# Patient Record
Sex: Male | Born: 1945
Health system: Southern US, Community
[De-identification: ages and names within clinical notes are randomized; demographics above are authoritative.]

## PROBLEM LIST (undated history)

## (undated) DIAGNOSIS — H5501 Congenital nystagmus: Secondary | ICD-10-CM

## (undated) DIAGNOSIS — I2510 Atherosclerotic heart disease of native coronary artery without angina pectoris: Secondary | ICD-10-CM

## (undated) DIAGNOSIS — E785 Hyperlipidemia, unspecified: Secondary | ICD-10-CM

## (undated) DIAGNOSIS — I10 Essential (primary) hypertension: Secondary | ICD-10-CM

## (undated) HISTORY — PX: CARDIAC CATHETERIZATION: SHX172

## (undated) HISTORY — DX: Atherosclerotic heart disease of native coronary artery without angina pectoris: I25.10

## (undated) HISTORY — DX: Hyperlipidemia, unspecified: E78.5

## (undated) HISTORY — DX: Essential (primary) hypertension: I10

## (undated) HISTORY — DX: Congenital nystagmus: H55.01

## (undated) HISTORY — PX: OTHER SURGICAL HISTORY: SHX169

---

## 2001-05-10 ENCOUNTER — Ambulatory Visit (HOSPITAL_COMMUNITY): Admission: RE | Admit: 2001-05-10 | Discharge: 2001-05-10 | Payer: Self-pay | Admitting: *Deleted

## 2001-05-10 ENCOUNTER — Encounter (INDEPENDENT_AMBULATORY_CARE_PROVIDER_SITE_OTHER): Payer: Self-pay | Admitting: Specialist

## 2007-01-29 ENCOUNTER — Ambulatory Visit: Payer: Self-pay | Admitting: Internal Medicine

## 2007-01-29 LAB — CONVERTED CEMR LAB
ALT: 27 units/L (ref 0–40)
CO2: 29 meq/L (ref 19–32)
Calcium: 9.8 mg/dL (ref 8.4–10.5)
Chloride: 108 meq/L (ref 96–112)
Cholesterol: 143 mg/dL (ref 0–200)
Creatinine, Ser: 1.1 mg/dL (ref 0.4–1.5)
GFR calc Af Amer: 88 mL/min
Glucose, Bld: 106 mg/dL — ABNORMAL HIGH (ref 70–99)
HDL: 41 mg/dL (ref >39.0)
LDL Cholesterol: 85 mg/dL (ref 0–99)
Phosphorus: 4 mg/dL (ref 2.3–4.6)

## 2007-03-12 ENCOUNTER — Ambulatory Visit: Payer: Self-pay | Admitting: Internal Medicine

## 2007-05-04 ENCOUNTER — Telehealth (INDEPENDENT_AMBULATORY_CARE_PROVIDER_SITE_OTHER): Payer: Self-pay | Admitting: *Deleted

## 2007-05-07 ENCOUNTER — Ambulatory Visit: Payer: Self-pay | Admitting: Internal Medicine

## 2007-05-07 DIAGNOSIS — R079 Chest pain, unspecified: Secondary | ICD-10-CM | POA: Insufficient documentation

## 2007-05-08 ENCOUNTER — Ambulatory Visit: Payer: Self-pay | Admitting: Cardiovascular Disease

## 2007-05-08 LAB — CONVERTED CEMR LAB
BUN: 22 mg/dL (ref 6–23)
Basophils Absolute: 0 10*3/uL (ref 0.0–0.1)
Basophils Relative: 0.1 % (ref 0.0–1.0)
CO2: 29 meq/L (ref 19–32)
Calcium: 9.8 mg/dL (ref 8.4–10.5)
Chloride: 107 meq/L (ref 96–112)
Creatinine, Ser: 1 mg/dL (ref 0.4–1.5)
HCT: 45.6 % (ref 39.0–52.0)
Hemoglobin: 15.4 g/dL (ref 13.0–17.0)
INR: 1 (ref 0.9–2.0)
MCHC: 33.8 g/dL (ref 30.0–36.0)
MCV: 93.1 fL (ref 78.0–100.0)
Monocytes Absolute: 1 10*3/uL — ABNORMAL HIGH (ref 0.2–0.7)
Neutrophils Relative %: 55.7 % (ref 43.0–77.0)
Platelets: 190 10*3/uL (ref 150–400)
Potassium: 4.1 meq/L (ref 3.5–5.1)
Prothrombin Time: 11.8 s (ref 10.0–14.0)
RBC: 4.9 M/uL (ref 4.22–5.81)
RDW: 12 % (ref 11.5–14.6)
Sodium: 141 meq/L (ref 135–145)

## 2007-05-09 ENCOUNTER — Ambulatory Visit: Payer: Self-pay | Admitting: Cardiovascular Disease

## 2007-05-09 ENCOUNTER — Inpatient Hospital Stay (HOSPITAL_COMMUNITY): Admission: RE | Admit: 2007-05-09 | Discharge: 2007-05-11 | Payer: Self-pay | Admitting: Cardiovascular Disease

## 2007-05-25 ENCOUNTER — Ambulatory Visit: Payer: Self-pay | Admitting: Cardiovascular Disease

## 2007-05-31 ENCOUNTER — Ambulatory Visit: Payer: Self-pay

## 2007-06-18 IMAGING — CR DG CHEST 2V
2 series · 2 of 2 positions shown · non-contrast
Comparison: none

CLINICAL DATA: Pre-cardiac catheterization.  Chest pain.
 CHEST ? 2 VIEW:

[view not recorded (1 of 2)]
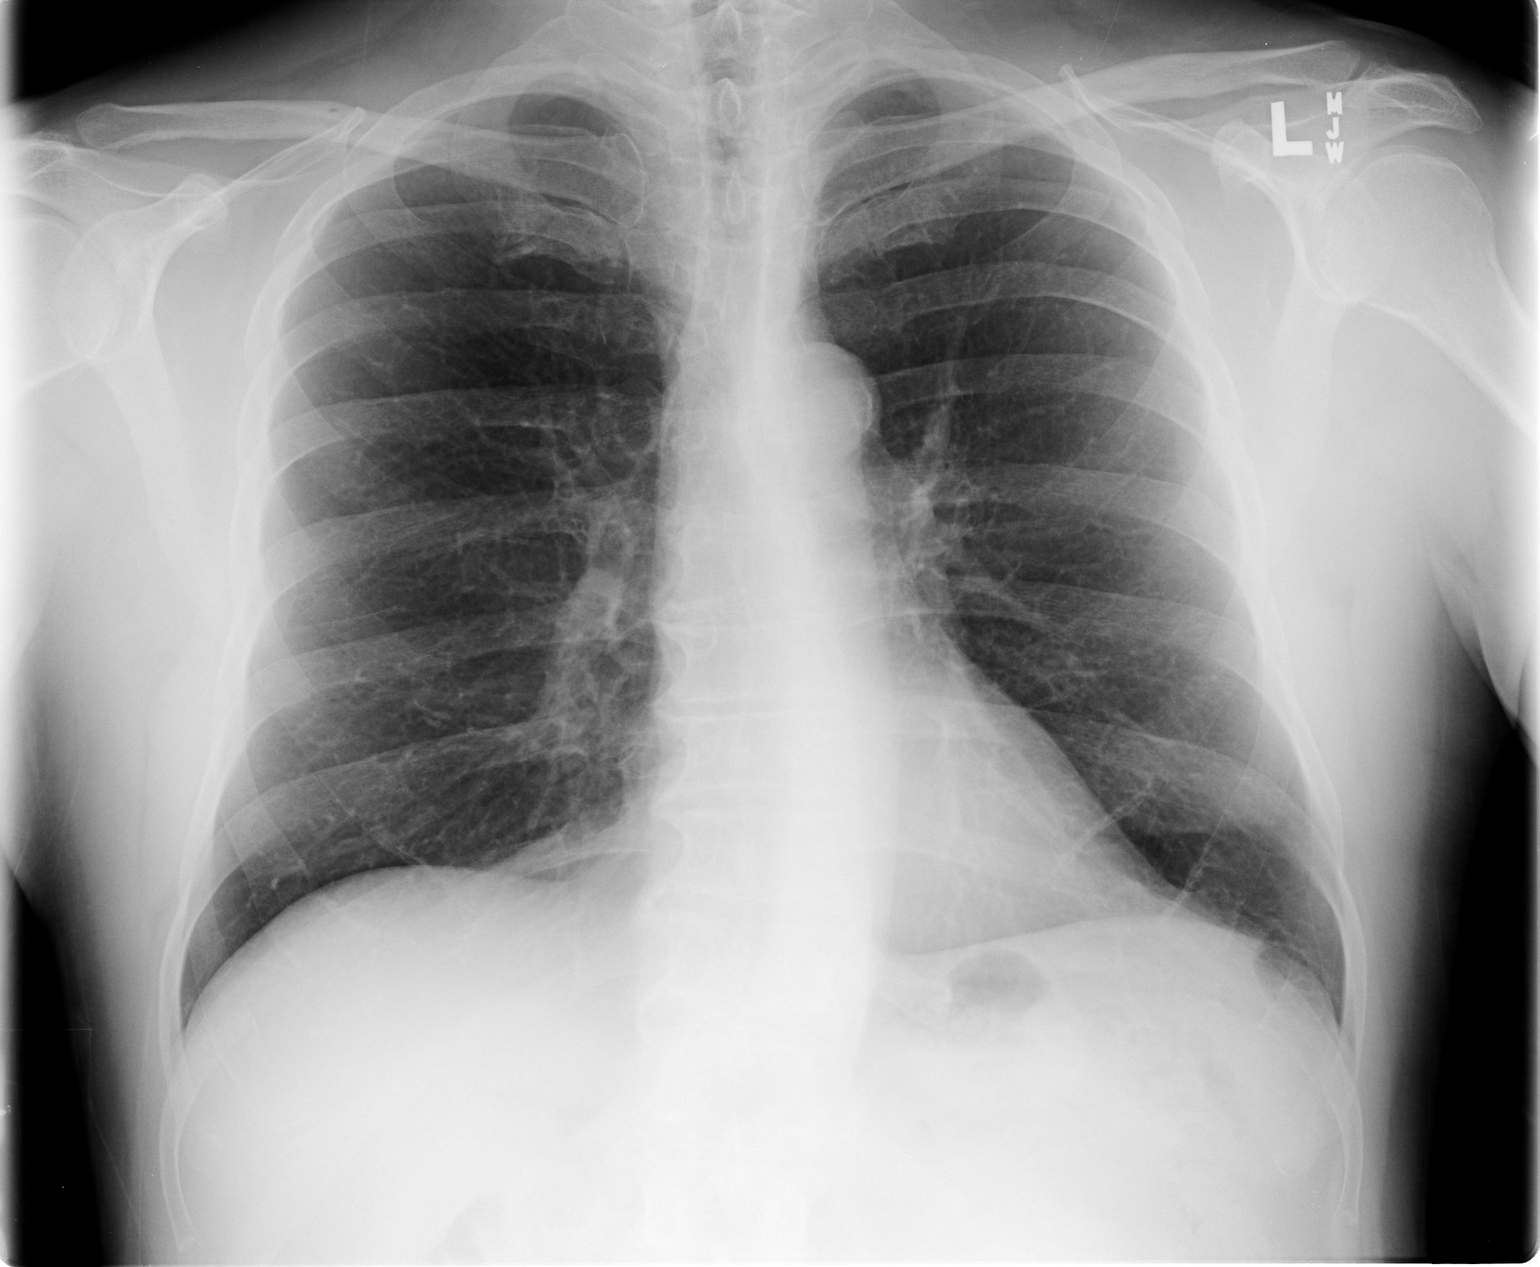

[view not recorded (2 of 2)]
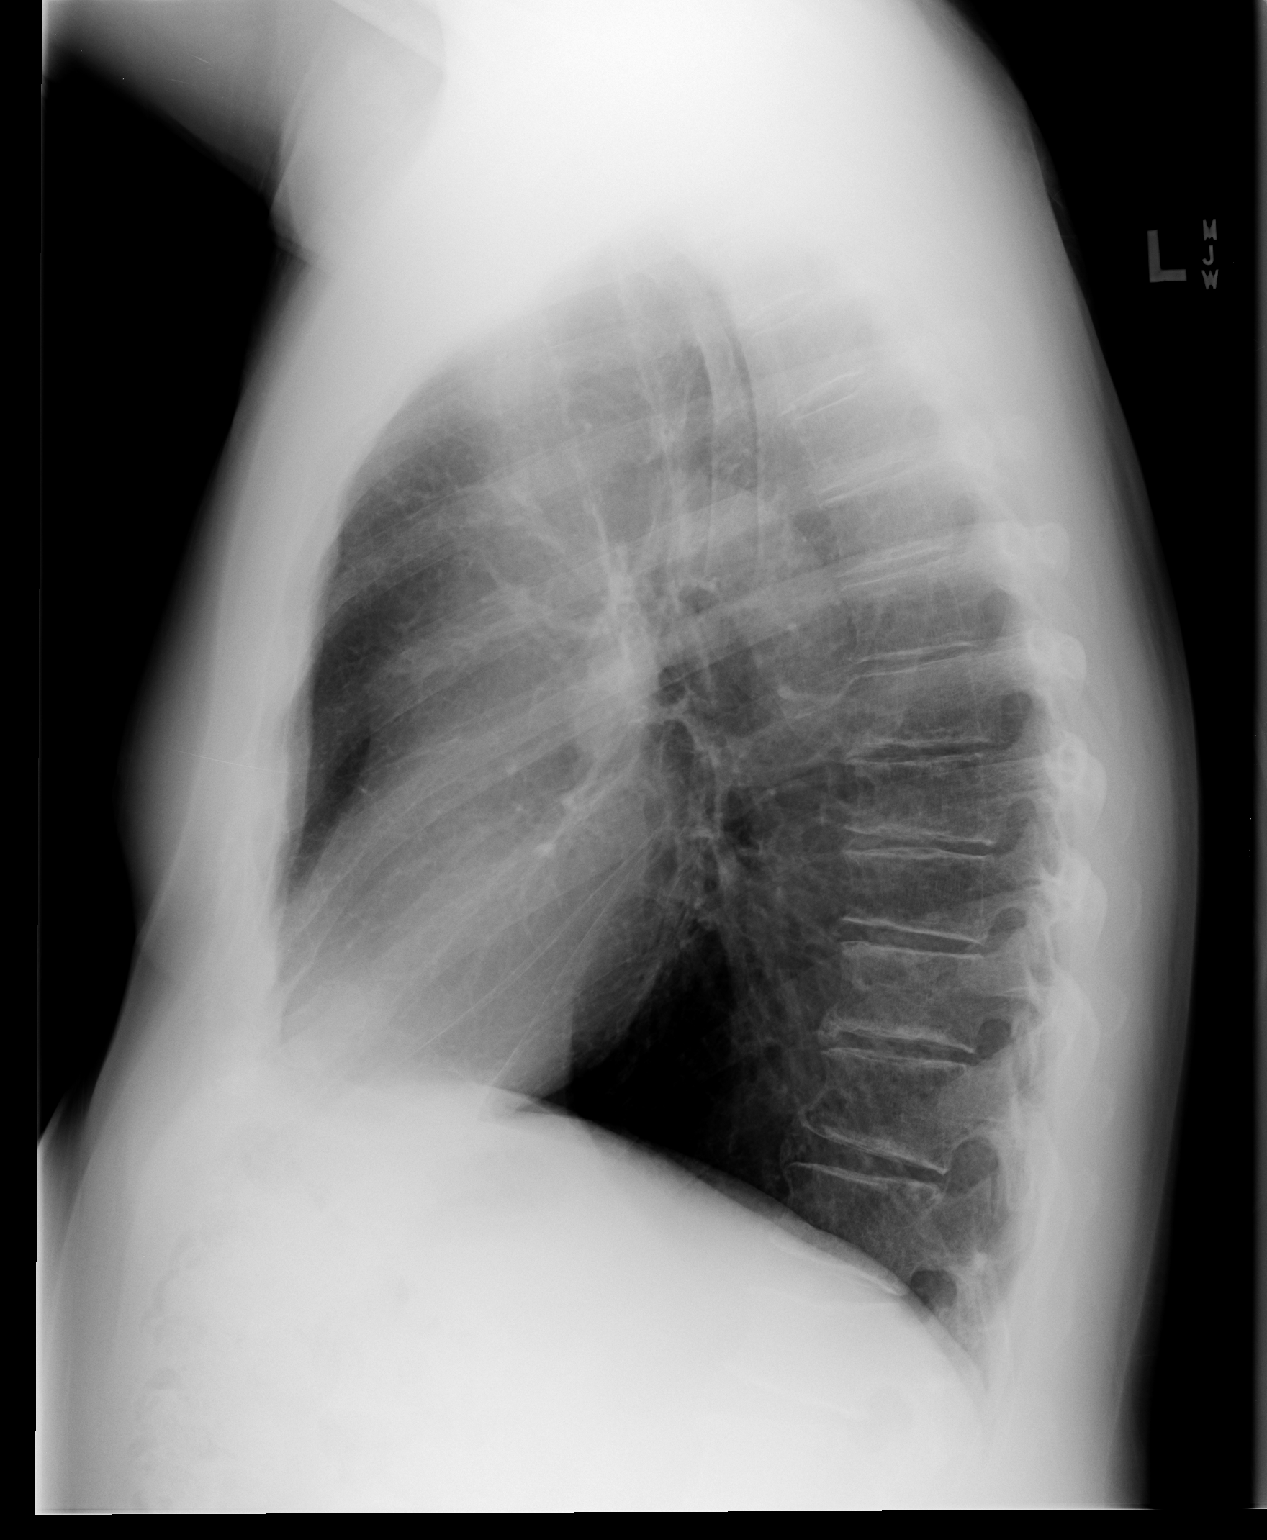

[2 of 2 positions shown; findings below may reference images not displayed]

FINDINGS: Two views of the chest show the lungs to be clear and hyperaerated.  The heart is within normal limits in size.  No bony abnormality is seen.
IMPRESSION: Hyperaeration.  No active lung disease.

## 2007-07-13 ENCOUNTER — Ambulatory Visit: Payer: Self-pay | Admitting: Cardiovascular Disease

## 2007-07-23 DIAGNOSIS — E785 Hyperlipidemia, unspecified: Secondary | ICD-10-CM | POA: Insufficient documentation

## 2007-07-23 DIAGNOSIS — I2510 Atherosclerotic heart disease of native coronary artery without angina pectoris: Secondary | ICD-10-CM | POA: Insufficient documentation

## 2007-07-23 DIAGNOSIS — I10 Essential (primary) hypertension: Secondary | ICD-10-CM | POA: Insufficient documentation

## 2007-07-23 DIAGNOSIS — H5501 Congenital nystagmus: Secondary | ICD-10-CM | POA: Insufficient documentation

## 2007-07-24 ENCOUNTER — Telehealth (INDEPENDENT_AMBULATORY_CARE_PROVIDER_SITE_OTHER): Payer: Self-pay | Admitting: *Deleted

## 2007-09-19 ENCOUNTER — Ambulatory Visit: Payer: Self-pay | Admitting: Cardiovascular Disease

## 2007-09-19 LAB — CONVERTED CEMR LAB
Alkaline Phosphatase: 65 units/L (ref 39–117)
Cholesterol: 145 mg/dL (ref 0–200)
LDL Cholesterol: 92 mg/dL (ref 0–99)
Total Bilirubin: 1.2 mg/dL (ref 0.3–1.2)
Total CHOL/HDL Ratio: 4.4
Total Protein: 6.8 g/dL (ref 6.0–8.3)
Triglycerides: 100 mg/dL (ref 0–149)

## 2007-10-19 ENCOUNTER — Ambulatory Visit: Payer: Self-pay | Admitting: Internal Medicine

## 2007-11-09 ENCOUNTER — Ambulatory Visit: Payer: Self-pay | Admitting: Cardiovascular Disease

## 2008-01-11 ENCOUNTER — Ambulatory Visit: Payer: Self-pay | Admitting: Cardiovascular Disease

## 2008-01-11 LAB — CONVERTED CEMR LAB
ALT: 21 units/L (ref 0–53)
AST: 23 units/L (ref 0–37)
Bilirubin, Direct: 0.2 mg/dL (ref 0.0–0.3)
Cholesterol: 134 mg/dL (ref 0–200)
HDL: 39.3 mg/dL (ref >39.0)
Total Bilirubin: 1.1 mg/dL (ref 0.3–1.2)
Total CHOL/HDL Ratio: 3.4
Total Protein: 6.7 g/dL (ref 6.0–8.3)
Triglycerides: 54 mg/dL (ref 0–149)
VLDL: 11 mg/dL (ref 0–40)

## 2008-05-12 ENCOUNTER — Ambulatory Visit: Payer: Self-pay | Admitting: Internal Medicine

## 2008-06-02 ENCOUNTER — Ambulatory Visit: Payer: Self-pay | Admitting: Cardiovascular Disease

## 2008-09-01 ENCOUNTER — Ambulatory Visit: Payer: Self-pay | Admitting: Internal Medicine

## 2008-11-30 DIAGNOSIS — I2510 Atherosclerotic heart disease of native coronary artery without angina pectoris: Secondary | ICD-10-CM | POA: Insufficient documentation

## 2009-01-27 ENCOUNTER — Ambulatory Visit: Payer: Self-pay | Admitting: Cardiovascular Disease

## 2009-01-27 LAB — CONVERTED CEMR LAB
ALT: 26 units/L (ref 0–53)
AST: 28 units/L (ref 0–37)
Alkaline Phosphatase: 59 units/L (ref 39–117)
Bilirubin, Direct: 0.1 mg/dL (ref 0.0–0.3)
Cholesterol: 155 mg/dL (ref 0–200)
Total Bilirubin: 0.9 mg/dL (ref 0.3–1.2)
Total Protein: 6.5 g/dL (ref 6.0–8.3)
Triglycerides: 122 mg/dL (ref 0–149)

## 2009-04-28 ENCOUNTER — Ambulatory Visit: Payer: Self-pay | Admitting: Cardiovascular Disease

## 2009-04-29 ENCOUNTER — Telehealth: Payer: Self-pay | Admitting: Cardiovascular Disease

## 2009-04-29 LAB — CONVERTED CEMR LAB
ALT: 16 units/L (ref 0–53)
Albumin: 3.7 g/dL (ref 3.5–5.2)
Bilirubin, Direct: 0 mg/dL (ref 0.0–0.3)
HDL: 33.7 mg/dL — ABNORMAL LOW (ref >39.00)
LDL Cholesterol: 52 mg/dL (ref 0–99)
Total Bilirubin: 0.8 mg/dL (ref 0.3–1.2)
Total CHOL/HDL Ratio: 3
Total Protein: 6.4 g/dL (ref 6.0–8.3)
Triglycerides: 91 mg/dL (ref 0.0–149.0)

## 2009-09-14 ENCOUNTER — Telehealth: Payer: Self-pay | Admitting: Cardiovascular Disease

## 2009-10-08 ENCOUNTER — Ambulatory Visit: Payer: Self-pay | Admitting: Cardiovascular Disease

## 2010-01-21 ENCOUNTER — Ambulatory Visit: Payer: Self-pay | Admitting: Cardiovascular Disease

## 2010-02-03 LAB — CONVERTED CEMR LAB
ALT: 17 units/L (ref 0–53)
Bilirubin, Direct: 0.1 mg/dL (ref 0.0–0.3)
HDL: 43.9 mg/dL (ref >39.00)
LDL Cholesterol: 85 mg/dL (ref 0–99)
Total Bilirubin: 0.9 mg/dL (ref 0.3–1.2)
Total Protein: 6.5 g/dL (ref 6.0–8.3)
VLDL: 18 mg/dL (ref 0.0–40.0)

## 2010-02-25 ENCOUNTER — Encounter: Payer: Self-pay | Admitting: Cardiovascular Disease

## 2010-07-12 ENCOUNTER — Telehealth (INDEPENDENT_AMBULATORY_CARE_PROVIDER_SITE_OTHER): Payer: Self-pay | Admitting: *Deleted

## 2010-08-12 ENCOUNTER — Telehealth: Payer: Self-pay | Admitting: Cardiovascular Disease

## 2010-08-30 ENCOUNTER — Encounter: Payer: Self-pay | Admitting: Cardiovascular Disease

## 2010-10-08 ENCOUNTER — Ambulatory Visit: Payer: Self-pay | Admitting: Cardiovascular Disease

## 2010-10-08 ENCOUNTER — Encounter: Payer: Self-pay | Admitting: Cardiovascular Disease

## 2010-10-14 ENCOUNTER — Telehealth: Payer: Self-pay | Admitting: Cardiovascular Disease

## 2011-01-04 NOTE — Progress Notes (Signed)
Summary: dicuss lab work   Phone Note Call from Patient Call back at [employer] [phone]   Caller: Patient Reason for Call: Talk to Nurse Summary of Call: pt having upcoming lab @ pcp,. wants to discuss lab work.  Initial call taken by: Lorne Skeens,  August 12, 2010 10:10 AM  Follow-up for Phone Call        I asked the pt to have his PCP fax his lab results to our office.  The pt is scheduled to see Dr Excell Seltzer in November. Follow-up by: Julieta Gutting, RN, BSN,  August 12, 2010 12:38 PM

## 2011-01-04 NOTE — Assessment & Plan Note (Signed)
Summary: f1y   Visit Type:  1 year follow up Primary Provider:  Cindee Salt MD  CC:  No cardiac complaints.  History of Present Illness: Jason Bell is a 65 year old gentleman with the coronary artery disease who initially presented with class III angina.  He underwent stenting of the proximal LAD and mid left circumflex in 2008 with drug-eluting stent platforms.  He has a chronically occluded right coronary artery that is collateralized from the left.  He underwent a Myoview perfusion scan in June 2008 following this intervention that demonstrated previous inferior and inferoseptal infarct with no ischemia with LV EF of 64% and the overall findings were considered low risk.   He continues to do well wihtout cardiovascular complaints. Denies chest pain, dyspnea, edema, palpitation. He does not exercise. He does physical work and has no exeritonal symptoms. Complains of ED and has better results with Cialis 20 mg - requests Rx for this.   Current Medications (verified): 1)  Metoprolol Succinate 50 Mg Xr24h-Tab (Metoprolol Succinate) .... Take One Tablet By Mouth Daily 2)  Multivitamins   Tabs (Multiple Vitamin) .... Take One By Mouth Once A Day 3)  Fish Oil 1000 Mg  Caps (Omega-3 Fatty Acids) .... Take One By Mouth Three Times A Day 4)  Cialis 10 Mg Tabs (Tadalafil) .... Take 1 Tablet By Mouth As Directed 5)  Simvastatin 40 Mg Tabs (Simvastatin) .... Take One Tablet By Mouth Daily At Bedtime 6)  Aspirin 81 Mg Tbec (Aspirin) .... Take 2 Tablets Am  Allergies: 1)  Lipitor (Atorvastatin Calcium)  Past History:  Past medical history reviewed for relevance to current acute and chronic problems.  Past Medical History: Reviewed history from 10/08/2009 and no changes required. Coronary artery disease s/p multivessel PCI 2008 Hyperlipidemia Hypertension Congenital nystagmus   Review of Systems       Negative except as per HPI   Vital Signs:  Patient profile:   65 year old  male Height:      65 inches Weight:      172 pounds BMI:     28.73 Pulse rate:   59 / minute Pulse rhythm:   regular Resp:     18 per minute BP sitting:   121 / 77  (left arm) Cuff size:   large  Vitals Entered By: Vikki Ports (October 08, 2010 8:44 AM)  Physical Exam  General:  Pt is alert and oriented, in no acute distress. HEENT: normal Neck: normal carotid upstrokes without bruits, JVP normal Lungs: CTA CV: RRR without murmur or gallop Abd: soft, NT, positive BS, no bruit, no organomegaly Ext: no clubbing, cyanosis, or edema. peripheral pulses 2+ and equal Skin: warm and dry without rash    EKG  Procedure date:  10/08/2010  Findings:      NSR 59 bpm, within normal limits.  Impression & Recommendations:  Problem # 1:  CAD, NATIVE VESSEL (ICD-414.01) Stable wihtout angina. Discussed need for weight loss and increased exercise. He has a treadmill he will begin to use. No med changes today except for Cialis 20 mg Rx - he understands contraindication with NTG.  His updated medication list for this problem includes:    Metoprolol Succinate 50 Mg Xr24h-tab (Metoprolol succinate) .Marland Kitchen... Take one tablet by mouth daily    Aspirin 81 Mg Tbec (Aspirin) .Marland Kitchen... Take 2 tablets am  Orders: EKG w/ Interpretation (93000)  Problem # 2:  HYPERTENSION (ICD-401.9) Well-controlled  His updated medication list for this problem includes:  Metoprolol Succinate 50 Mg Xr24h-tab (Metoprolol succinate) .Marland Kitchen... Take one tablet by mouth daily    Aspirin 81 Mg Tbec (Aspirin) .Marland Kitchen... Take 2 tablets am  Orders: EKG w/ Interpretation (93000)  BP today: 121/77 Prior BP: 120/78 (10/08/2009)  Labs Reviewed: K+: 4.1 (05/08/2007) Creat: : 1.0 (05/08/2007)   Chol: 147 (01/21/2010)   HDL: 43.90 (01/21/2010)   LDL: 85 (01/21/2010)   TG: 90.0 (01/21/2010)  Problem # 3:  HYPERLIPIDEMIA (ICD-272.4) Lipids at goal. Lifestyle modification reviewed with patient.  His updated medication list for this  problem includes:    Simvastatin 40 Mg Tabs (Simvastatin) .Marland Kitchen... Take one tablet by mouth daily at bedtime  Orders: EKG w/ Interpretation (93000)  CHOL: 147 (01/21/2010)   LDL: 85 (01/21/2010)   HDL: 43.90 (01/21/2010)   TG: 90.0 (01/21/2010)  Patient Instructions: 1)  Your physician has recommended you make the following change in your medication: INCREASE Cialis to 20mg  as needed  2)  Your physician wants you to follow-up in: 1 YEAR.   You will receive a reminder letter in the mail two months in advance. If you don't receive a letter, please call our office to schedule the follow-up appointment. Prescriptions: CIALIS 20 MG TABS (TADALAFIL) take one tablet by mouth as needed daily  #12 x 3   Entered by:   Julieta Gutting, RN, BSN   Authorized by:   Norva Karvonen, MD   Signed by:   Julieta Gutting, RN, BSN on 10/08/2010   Method used:   Electronically to        [facility] [phone]* (retail)       [address] [place], [place]  [address]       Ph: [phone] or [phone]       Fax: [phone]   RxID:   [id number]

## 2011-01-04 NOTE — Progress Notes (Signed)
Summary: REFILL  Phone Note Refill Request Message from:  Patient on October 14, 2010 4:03 PM  Refills Requested: Medication #1:  METOPROLOL SUCCINATE 50 MG XR24H-TAB Take one tablet by mouth daily [facility] [phone]  Initial call taken by: Judie Grieve,  October 14, 2010 4:03 PM  Follow-up for Phone Call        Rx faxed to pharmacy. Vikki Ports  October 14, 2010 4:44 PM      Prescriptions: METOPROLOL SUCCINATE 50 MG XR24H-TAB (METOPROLOL SUCCINATE) Take one tablet by mouth daily  #90 Tablet x 3   Entered by:   Vikki Ports   Authorized by:   Norva Karvonen, MD   Signed by:   Vikki Ports on 10/14/2010   Method used:   Faxed to ...       [facility] [phone]* (retail)       [address] [place], [place]  [address]       Ph: [phone] or [phone]       Fax: [phone]   RxID:   [id number]

## 2011-01-04 NOTE — Progress Notes (Signed)
Summary: chest tightness with exertion  Phone Note Call from Patient   Caller: Patient Call For: letvak Summary of Call: pt /co chest /shoulder tightness with  mild exertion and elevated bp for past few days. he is anxious about it and says his bp was 158/92 after waking up from a nap 30 min ago. he wants appt can I scuedule monday? Initial call taken by: Liane Comber,  May 04, 2007 2:59 PM  Follow-up for Phone Call        okay to schedule on Monday but make sure he knows to call 911 if he has significant pain that lasts for more than a very brief period Follow-up by: Cindee Salt MD,  May 04, 2007 3:31 PM  Additional Follow-up for Phone Call Additional follow up Details #1::        PATIENT ADVISED appt scheduled Additional Follow-up by: Liane Comber,  May 04, 2007 4:09 PM

## 2011-01-04 NOTE — Progress Notes (Signed)
Summary: refill  Phone Note Refill Request Call back at Home Phone [phone] Message from:  Patient on July 12, 2010 4:40 PM  Refills Requested: Medication #1:  CIALIS 10 MG TABS Take 1 tablet by mouth as directed [facility]  Initial call taken by: Judie Grieve,  July 12, 2010 4:41 PM Caller: Patient  Follow-up for Phone Call        Rx called to pharmacy. Vikki Ports  July 12, 2010 4:46 PM      Prescriptions: CIALIS 10 MG TABS (TADALAFIL) Take 1 tablet by mouth as directed  #12 x 2   Entered by:   Vikki Ports   Authorized by:   Norva Karvonen, MD   Signed by:   Vikki Ports on 07/12/2010   Method used:   Faxed to ...       [facility] [phone]* (retail)       [address] [place], [place]  [address]       Ph: [phone] or [phone]       Fax: [phone]   RxID:   [id number]

## 2011-04-19 NOTE — Assessment & Plan Note (Signed)
[facility]                            CARDIOLOGY OFFICE NOTE   QUANTAE, MARTEL                     MRN:          [id number]  DATE:05/25/2007                            DOB:          10-Feb-1946    Olaf Mesa returns for followup at the [facility] Office on  May 25, 2007.  He is a 65 year old man with coronary artery disease.  I  initially saw him on June 3 when he presented with crescendo angina.  He  had very typical symptoms and we elected to proceed directly with  cardiac catheterization.  His catheterization demonstrated 3-vessel  coronary artery disease.  He had focal disease in the LAD and left  circumflex, and an occluded right coronary artery that was  collateralized from the left.  He had inferior wall hypokinesis with an  LVEF estimated at 50%.  I elected to perform PCI of the LAD and left  circumflex with drug-eluting stents.  His angiographic result was  excellent.  Since his return home, he has symptomatically improved.  He  has continued to have some intermittent chest pains, many of which have  been fleeting, non-exertional pains.  He had 1 episode of exertional  chest discomfort that felt similar to the symptoms that he presented  with.  He has returned to light to moderate level activity and overall  is doing fairly well.   CURRENT MEDICATIONS:  1. Vytorin 10/20 mg daily.  2. Multivitamin daily.  3. Metoprolol ER 50 mg daily.  4. Aspirin 325 mg daily.  5. Plavix 75 mg daily.   EXAMINATION:  He is alert and oriented in no acute distress.  Weight 166 pounds, blood pressure 104/80, heart rate is 65, respiratory  rate 16.  HEENT:  Normal.  NECK:  Normal carotid upstrokes without bruits.  Jugular venous pressure  is normal.  LUNGS:  Clear to auscultation bilaterally.  HEART:  Regular rate and rhythm without murmurs or gallops.  ABDOMEN:  Soft and nontender.  No organomegaly.  No abdominal bruits.  EXTREMITIES:   No cyanosis, clubbing, or edema.  Peripheral pulses are 2+  and equal throughout.  Right femoral access site is well-healed.   EKG shows normal sinus rhythm and is within normal limits.   ASSESSMENT:  Mr. Sanzo is currently stable from a cardiovascular  standpoint.  His cardiac problems are as follows.  1. Coronary artery disease with [place] Cardiovascular Society class      II stable angina.  He has had improvement in his symptoms, although      I am concerned that he has continued to have occasional angina.  I      was hopeful that he would have a better symptomatic response to      percutaneous coronary intervention.  I would like to perform an      exercise Myoview stress study to evaluate for significant ischemia.      If he has significant ischemia in the inferior wall, may have to      consider revascularization of his right coronary artery.  However,  his right coronary artery is a fairly small vessel and I am not      sure that it would respond well to percutaneous coronary      intervention.  It is an occluded vessel that has already      collateralized, but if there is significant ischemia in that      territory, then an attempt at opening that up may be worth while.      In the meantime, he should continue on his current medical therapy      with beta blockade, aspirin, clopidogrel, and Vytorin.  2. Regarding his dyslipidemia, I elected to increase his Vytorin to      10/40 mg daily.  He should have repeat lipids and LFTs in 12 weeks.   FOLLOWUP:  I would like to see Mr. Defranco back in 8 weeks.  I will  follow up with him by telephone after the results of this stress study  are complete.     [name], MD  Electronically Signed    MDC/MedQ  DD: 05/28/2007  DT: 05/29/2007  Job #: [id number]   cc:   Karie Schwalbe, MD

## 2011-04-19 NOTE — Cardiovascular Report (Signed)
NAMEJURIS, Jason [name] NO.:  [id number]   MEDICAL RECORD NO.:  [id number]          PATIENT TYPE:  OIB   LOCATION:  6527                         FACILITY:  [facility]   PHYSICIAN:  [name], MD  DATE OF BIRTH:  November 21, 1946   DATE OF PROCEDURE:  05/09/2007  DATE OF DISCHARGE:                            CARDIAC CATHETERIZATION   PROCEDURE:  Left heart catheterization, selective coronary angiography,  left ventricular angiography, IVUS of the LAD, stenting of the LAD, PTCA  and stenting of the left circumflex, PTCA and stenting of the first  obtuse marginal, PTCA of the left circumflex, and StarClose of the right  femoral artery.   INDICATION:  Jason [name] is a 65 year old gentleman who I saw in clinic  on June 03 with new-onset angina.  His symptoms are classic for unstable  angina as he had sudden onset of exertional chest pain 6 days prior to  his office visit.  He has had exertional chest pressure with minimal  activity.  He did not have any resting symptoms.  I elected to bring him  in for a left heart catheterization in the setting of his classic  symptoms.   Risks and indications of the procedure were explained in detail to the  patient.  Informed consent was obtained.  The right groin was prepped,  draped, and anesthetized with 1% lidocaine.  Using modified Seldinger  technique, a 6-French sheath was placed in the right femoral artery.  Multiple views of the left coronary artery were taken.  A single view of  the right coronary artery was taken.  Standard Judkins catheters were  used.  Following selective coronary angiography, an angled pigtail  catheter was inserted into the left ventricle and pressures were  recorded.  Left ventriculogram was performed.  Pullback across the  aortic valve was done.   The diagnostic procedure demonstrated moderate to high-grade disease in  the proximal LAD and a very high-grade stenosis of the left circumflex  involving the first OM bifurcation as well as total occlusion of the  right coronary artery with left-to-right collaterals.  The right  coronary artery was a small vessel.  The disease was focal.  I elected  to perform intravascular ultrasound of the LAD to help in decision  making regarding revascularization.  Angiomax was used for  anticoagulation.  A 6-French XB 3.5 mm guide catheter was inserted.  Once therapeutic ACT was achieved, a Cougar guidewire was passed beyond  the area of stenosis in the proximal LAD and advanced down to the distal  LAD.  Intravascular ultrasound demonstrated high-grade stenosis  beginning at the first diagonal branch and covering approximately 10 mm  of the proximal LAD.  There was superficial calcification and a minimal  lumen area of 2.5 square millimeters.  I elected to primarily stent this  area with a 3.0 x 16 mm Endeavor stent.  The stent was deployed at 16  atmospheres.  I elected to postdilate the proximal portion of the stent  with a 3.5 x 8-mm Quantum Maverick which was inflated to 18 atmospheres.  Following postdilatation, a post-stent IVUS  was performed which  demonstrated good apposition throughout.  At that point, attention was  turned to the left circumflex.  The same Cougar guidewire was passed  down into the first OM branch.  The lesion was predilated with a 2.0 x  12 mm Sprinter balloon to 10 atmospheres.  Multiple views were taken and  this was a bifurcation lesion.  There appeared to be stenosis involving  the true circumflex as well.  I  elected to place a second Cougar  guidewire into the true circumflex branch.  The same 2.0  x 12 mm  balloon was advanced down and used to dilate the circumflex at 14  atmospheres.  Following this, the circumflex wire was pulled and a 2.5 x  12 mm Endeavor stent was placed from the mid circumflex across into the  OM-1 branch and deployed at 16 atmospheres.  The stent was then  postdilated with a 2.75 x 8  mm Quantum Maverick to 18 atmospheres on two  inflations.  Following postdilatation, there was excellent stent  expansion and TIMI-III flow in both branches.  There was very minor  compromise of the left circumflex but there did not appear to be any  significant stenosis and there was TIMI-III flow.  I elected not to  perform any further dilatations on that branch as I wanted to avoid  disrupting the stent in any way.  At the conclusion of the procedure,  right femoral arteriotomy was sealed with a StarClose device.  Mr.  [name] had a vagal reaction after the StarClose device was deployed  and he was given 1 mg of atropine.  He responded well and was in stable  condition at the conclusion of the procedure.   FINDINGS:  Aortic pressure 126/81 with a mean of 100.  Left ventricular  pressure is 129/6.   Left mainstem is angiographically normal.  It trifurcates into the LAD  intermediate branch and left circumflex.   The LAD is a large-caliber vessel that courses down and wraps around the  left ventricular apex.  There is a first diagonal branch that has a 50%  proximal stenosis.  The proximal portion of the LAD has an area of  moderate disease that is hypodense in appearance.  Angiographically, it  appears to be in the range of 70-75%.  The remaining portions of the mid  and distal LAD had no significant angiographic disease.   The ramus intermedius branch is a medium-sized vessel.  There is a 40%  stenosis in the proximal third of the vessel.   The left circumflex is a medium-sized vessel.  It courses down and gives  off a first OM branch.  Just at the first OM bifurcation, there is a 95%  stenosis in the mid circumflex.  The true circumflex and first OM branch  both appear to be 2.0-2.5 mm vessels and are both involved in the area  of high-grade stenosis.  The true AV groove circumflex courses down and  distally as a relatively small vessel.  The right coronary artery is a small  vessel that is dominant.  It is  diffusely diseased.  The mid portion of the vessel is occluded.  The  proximal portion of the vessel has moderate stenosis.  The distal right  coronary artery fills via left-to-right collateral flow.   Left ventriculography demonstrates mid inferior wall hypokinesis with an  LVEF of 50%.  There is no mitral regurgitation present.   ASSESSMENT:  1. Three-vessel coronary artery disease.  a.     Severe left circumflex disease involving the first obtuse       marginal bifurcation.      b.     There is moderate to severe left anterior descending       stenosis with a minimal lumen area of 2.5 millimeters by       intravascular ultrasound.      c.     Right coronary artery occlusion with left-to-right       collaterals.  2. Mild segmental left ventricular dysfunction with preserved overall      left ventricular ejection fraction.  3. Successful percutaneous coronary intervention of the left anterior      descending and left circumflex with single drug-eluting stents in      each vessel.   PLAN:  I recommend aspirin and clopidogrel for a minimum of 12 months  and if well tolerated then should continue with indefinite use.  We will  monitor overnight and continue with current medical therapy.      [name], MD  Electronically Signed     MDC/MEDQ  D:  05/10/2007  T:  05/10/2007  Job:  [id number]   cc:   Karie Schwalbe, MD

## 2011-04-19 NOTE — Letter (Signed)
May 08, 2007    Karie Schwalbe, MD  [address] [place], [place] [address]   RE:  Jason, [name]  MRN:  [id number]  /  DOB:  05-29-1946   Dear Luan Pulling:   It was my pleasure to see Jason [name] as an outpatient at the [facility]  Cardiology Office on May 08, 2007.  As you know, he is a 65 year old  gentleman who presented with a chief complaint of chest pain.   Jason [name] describes the onset of exertional chest pain approximately  6 days ago.  Prior to that, he had no similar symptoms.  He has had a  few twinges of chest pain in the past, but they have been fleeting.  Over the last 6 days, he describes substernal chest pressure radiating  to the shoulders, as well as up to the jaw with exertion.  He has had  symptoms at fairly low level activity, but has not had any resting  symptoms.  He denies any associated dyspnea, nausea, vomiting or  diaphoresis.  He went to the track 3 days ago and walked just over 1  mile to test his symptoms.  He had chest pain soon after starting his  walk, and it persisted until he stopped.  It lasted for approximately  another 15 minutes, and then resolved completely.  During that walk, he  described some lightheadedness as well, but has not had any other  associated symptoms.   PAST MEDICAL HISTORY:  Pertinent for the following:  1. Essential hypertension.  He has been treated with metoprolol for      the past 3 years.  He has no organ damage.  2. Dyslipidemia.  He is on Vytorin, and has also been taking this for      greater than 1 year.  3. No previous surgeries or hospitalizations.  4. No other medical problems to report.   SOCIAL HISTORY:  The patient is married.  He does not have children.  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  He has been  working there for many years.  He does not smoke cigarettes.  He drinks  alcohol on rare occasion.  He drinks a moderate level of caffeine.  He  does not participate  in any regular exercise.  He is originally from  [place], but has lived in [place] for over 20 years.   FAMILY HISTORY:  The patient's mother died at age 27 of multiple medical  problems.  His father died at age 52 of a cerebral hemorrhage.  He has a  sister who is 105, and has no medical problems.  There is no coronary  artery disease in the family.   CURRENT MEDICATIONS:  Include:  1. Metoprolol ER 25 mg daily.  2. Vytorin 10/20 mg daily.  3. Aspirin 81 mg daily.  4. Multivitamin daily.   ALLERGIES:  NO KNOWN DRUG ALLERGIES.   REVIEW OF SYSTEMS:  A complete 12-point review of systems was performed.  There were no pertinent positives to report.  He has no complaints other  than exertional chest pain, and the 1 episode of lightheadedness.   PHYSICAL EXAMINATION:  The patient is alert and oriented.  He is in no  acute distress.  He is a well-developed, well-nourished, white male.  His weight is 166 pounds.  Blood pressure in the right arm is 140/96,  left arm is 130/90.  Heart rate is 75.  Respiratory rate 16.  HEENT:  Normal.  NECK:  Normal carotid upstrokes without bruits.  Jugular venous pressure  is normal.  There is no thyromegaly or thyroid nodules.  LUNGS:  Clear to auscultation bilaterally.  CARDIOVASCULAR:  The apex is discrete and nondisplaced.  The heart is  regular rate and rhythm without murmurs or gallops.  There is no right  ventricular heave or lift.  ABDOMEN:  Soft and non-tender.  No organomegaly.  No abdominal bruits.  No masses.  BACK:  There is no CVA tenderness.  EXTREMITIES:  There is no clubbing, cyanosis, or edema.  There are no  femoral artery bruits.  The femoral pulses are 2+.  Posterior tibial and  dorsalis pedis pulses are 2+.  SKIN:  Warm and dry without rash.  NEUROLOGIC:  Cranial nerves 2 through 12 are intact.  Strength is 5/5  and equal in the arms and legs.  LYMPHATICS:  There is adenopathy.   EKG shows normal sinus rhythm, and  is within normal limits.  There are  no ST segment changes present.   Labs from February 2008 showed a creatinine of 1.1, potassium 4.5,  cholesterol 143, HDL 41, LDL 85, ALT 27.   ASSESSMENT:  Jason [name] is a 65 year old gentleman with new onset  exertional angina classic for unstable angina based on his recent  symptom onset.  This is in the setting of underlying cardiovascular risk  factors of hypertension and dyslipidemia.  As he does not have resting  symptoms, I did not feel that direct hospital admission is warranted.  However, with the recent onset of symptoms, and the occurrence with low  level activity, I think proceeding directly with cardiac catheterization  is warranted.  He has a high probability of significant underlying  coronary artery disease, and therefore, stress testing should be  skipped.  I reviewed this recommendation in detail with the patient and  his wife, and they are happy to proceed with cardiac catheterization, as  they are both very concerned about his new onset symptoms, and a  significant heart problem.  He is being scheduled for a left heart  catheterization tomorrow in the inpatient catheterization lab at [facility] in anticipation of requiring revascularization.  I reviewed the  risks and indications of the procedure in detail with the patient and  his wife, and they understand, and are agreeable to proceed.   I have asked Jason [name] to take 300 mg of Plavix today, and to start  75 mg of Plavix daily.  He was also asked to take 325 mg of aspirin  today, and start this dose as a daily dose as well.  His extended  release metoprolol dose was increased to 50 mg, and he was advised to  take an additional metoprolol pill this morning.  Finally, he was given  sublingual nitroglycerin, and instructed on its use.  I instructed them  in detail regarding the possibility of worsened chest pain or resting symptoms, and if this occurs, to activate EMS.    Rich, thanks again for the opportunity to see Jason [name].  I will be  in touch after his catheterization tomorrow.  Please feel free to call  at any time with questions regarding his care.    Sincerely,      [name], MD  Electronically Signed    MDC/MedQ  DD: 05/08/2007  DT: 05/08/2007  Job #: [id number]

## 2011-04-19 NOTE — Assessment & Plan Note (Signed)
[facility]                            CARDIOLOGY OFFICE NOTE   AADVIK, ROKER                     MRN:          [id number]  DATE:06/02/2008                            DOB:          1946-01-03    Jason [name] was seen in follow up at the [facility] Cardiology office on  June 02, 2008.  Jason [name] is a 65 year old gentleman with coronary  artery disease and stable angina.  He underwent stenting of the proximal  LAD and mid left circumflex back in June 2008.  He has an occluded right  coronary artery and history of silent inferior wall infarct.  Mr.  [name] is stable with no angina at present.  He has been able to  participate in regular exercise over the last several months with no  problems.  He has developed a left knee problem and his exercise has  been limited because of his knee pain.  He specifically denies chest  pain, dyspnea, orthopnea, PND, or edema.   Medications include aspirin 325 mg daily, Plavix 75 mg daily, metoprolol  ER 50 mg daily, multivitamin daily, Vytorin 10/40 mg daily, and fish oil  3 capsules daily.   ALLERGIES:  NKDA.   PHYSICAL EXAM:  GENERAL:  The patient is alert and oriented.  He is in  no acute distress.  VITAL SIGNS:  Weight is 178 pounds, blood pressure 122/83, heart rate  58, and respiratory rate 12.  HEENT:  Normal.  NECK:  Normal carotid upstrokes without bruits.  Jugular venous pressure  normal.  LUNGS:  Clear bilaterally.  HEART:  Regular rate and rhythm without murmurs or gallops.  ABDOMEN:  Soft, nontender, and no organomegaly.  EXTREMITIES:  No clubbing, cyanosis or edema.  Peripheral pulses are  intact and equal throughout.   EKG shows sinus bradycardia and is otherwise within normal limits.   ASSESSMENT:  1. Coronary artery disease.  No angina at present.  Continue medical      therapy as outlined above.  Reduce aspirin dose to 81 mg daily, but      continue dual antiplatelet therapy with  a combination of aspirin      and Plavix.  I am in favor of 2 years of aspirin and Plavix in and      his gentle gentleman with multivessel stenting.  He has had no      bleeding problems or other contraindications to ongoing      antiplatelet therapy.  2. Dyslipidemia.  Continue Vytorin.  Lipids from February showed a      cholesterol of 134, triglyceride is 54, HDL 39, and LDL 84.  Follow      up 1 year in February 2010.  3. Left knee pain.  He asked about the use of ibuprofen or other      nonsteroidals.  I think it is reasonable to use this      intermittently.  As noted above, his aspirin dose was reduced to 81      mg.  I asked him to ibuprofen sparingly if needed for pain.  4. Erectile dysfunction.  Jason [name] notes problems with erectile      dysfunction since starting on medical therapy for his CAD.  I      suspect this is related to the use of his beta-blocker.  He has      already reduced his metoprolol dose on his own to 25 mg.  He      continues to have problems.  I have written him a prescription for      Cialis 10 mg as needed.  He was warned of the contraindication with      concomitant use of nitrates.   For follow up, I would like to see Jason [name] back in 6 months.     [name], MD  Electronically Signed    MDC/MedQ  DD: 06/02/2008  DT: 06/03/2008  Job #: [id number]   cc:   Karie Schwalbe, MD

## 2011-04-19 NOTE — Assessment & Plan Note (Signed)
[facility]                            CARDIOLOGY OFFICE NOTE   GENTLE, HOGE                     MRN:          [id number]  DATE:07/13/2007                            DOB:          Apr 04, 1946    Taym Twist was seen in followup at the [facility] Cardiology Office on  July 13, 2007.  This dictation is delayed greater than 2 months as it  was not initially dictated and the chart just came back to me for  followup labs.  The dictation is done from my notes as I do not recall  much of the office visit at this point.   Mr. Jason [name] is a 65 year old gentleman who has coronary artery disease  and underwent multivessel stenting in the setting of normal left  ventricular function back in June of this year.  He had drug-eluting  stents placed in the LAD and left circumflex.  His right coronary artery  was occluded and received collaterals from the left.  At the time of his  visit, he complained of some chest pain with heavy exertion, but overall  was improved.  His chest pain was described as tightness.  He had no  other symptoms documented in the notes.   MEDICATIONS:  1. Multivitamin daily.  2. Metoprolol ER 50 mg daily.  3. Aspirin 325 mg daily.  4. Plavix 75 mg daily.  5. Vytorin 10/20 mg daily.   EXAM:  Weight 166, blood pressure 126/87, heart rate 74.  NECK:  Jugular venous pressure normal.  Carotid upstrokes 2+ without  bruits.  LUNGS:  Clear to auscultation bilaterally.  HEART:  Regular rate and rhythm without murmurs or gallops.  ABDOMEN:  Soft and nontender.  EXTREMITIES:  No cyanosis, clubbing, or edema.   ASSESSMENT AND PLAN:  Mr. Wyss is a 65 year old gentleman with  coronary artery disease and stable [place] [employer] class  II angina.  His medical therapy was continued.  He was encouraged  towards regular aerobic activity and exercise.   Regarding his dyslipidemia, I have now received lipids back in  followup  that were done on October 15.  His cholesterol was 145, triglycerides  100, HDL 33, and LDL 92.  I have elected to increase his Vytorin from  10/20 to 10/40.  We will follow up lipids and LFTs in 3 months.   Mr. Atkison will be seen back in followup next month.     [name], MD  Electronically Signed   MDC/MedQ  DD: 09/28/2007  DT: 09/28/2007  Job #: [id number]

## 2011-04-19 NOTE — Assessment & Plan Note (Signed)
[facility]                            CARDIOLOGY OFFICE NOTE   Jason [name], Jason [name]                     MRN:          [id number]  DATE:11/09/2007                            DOB:          Nov 04, 1946    PRIMARY CARE PHYSICIAN:  Karie Schwalbe, MD   REASON FOR PRESENTATION:  Evaluate patient with coronary disease, status  post PTCA.   HISTORY OF PRESENT ILLNESS:  The patient was due to see Dr. Excell Seltzer today  but he got called away urgently.  He has a history of coronary artery  disease as described below.  He has actually done quite well since the  last time he was seen.  He was describing apparently some chest  discomfort in August when he saw Dr. Excell Seltzer.  However, now he is able to  walk and even does his treadmill without any chest discomfort.  He  denies any neck or arm discomfort.  He denies any neck or arm  discomfort.  He has had no palpitations, no presyncope or syncope.  He  has had no shortness of breath, PND or orthopnea.   PAST MEDICAL HISTORY:  1. Coronary artery disease, occluded right coronary artery, 75%      proximal LAD stenosis, 95% mid circumflex stenosis.  The patient      had stenting with Endeavor stents to both the circumflex and the      LAD.  2. Well-preserved ejection fraction.  3. Dyslipidemia.  4. Hypertension.   ALLERGIES:  None.   MEDICATIONS:  1. Multivitamin.  2. Metoprolol 50 mg daily.  3. Aspirin 325 mg daily.  4. Plavix 75 mg daily.  5. Vytorin 10/40 mg.   REVIEW OF SYSTEMS:  As stated in the HPI and otherwise negative for  other systems.   PHYSICAL EXAMINATION:  The patient is in no distress.  Blood pressure 126/80, heart rate 72 and regular, weight 168 pounds.  HEENT:  Eyelids unremarkable.  Pupils equal, round, and reactive to  light.  Fundi not visualized.  Oral mucosa unremarkable.  NECK:  No jugular venous distention at 45 degrees, carotid upstroke  brisk and symmetric.  No bruits, no  thyromegaly.  LYMPHATIC:  No adenopathy.  LUNGS:  Clear to auscultation bilaterally.  BACK:  No costovertebral angle tenderness.  CHEST:  Unremarkable.  HEART:  PMI not displaced or sustained.  S1 and S2 within normal limits.  No S3, no S4.  No clicks, no rubs, no murmurs.  ABDOMEN:  Flat, positive bowel sounds, normal in frequency and pitch.  No bruits, no rebound, no guarding, no midline pulsatile mass.  No  hepatomegaly, no splenomegaly.  SKIN: No rash, no nodule.  EXTREMITIES:  2+ pulses, no edema.   ASSESSMENT AND PLAN:  1. Coronary disease.  The patient is now having no angina.  He is      participating in secondary risk reduction.  At this point no      further cardiovascular testing is suggested.  2. Dyslipidemia.  He did have his Vytorin increased recently by Dr.      Excell Seltzer.  The plan  is to repeat his lipids in January.  Further      adjustments will be based on these results.  We talked about fish      oil.  He eats a little fish occasionally but I thought it might be      reasonable to supplement with fish oil.  Also, increasing his      exercise will increase his low HDL.  3. Follow-up.  He will see Dr. Excell Seltzer in about 6 months or sooner if      needed.     Rollene Rotunda, MD, [facility]  Electronically Signed    [name]  DD: 11/09/2007  DT: 11/10/2007  Job #: [id number]   cc:   [name], MD

## 2011-04-19 NOTE — Discharge Summary (Signed)
NAMEJERMANY, RIMEL NO.:  [id number]   MEDICAL RECORD NO.:  [id number]          PATIENT TYPE:  OIB   LOCATION:  6527                         FACILITY:  [facility]   PHYSICIAN:  [name], MD  DATE OF BIRTH:  1946-01-06   DATE OF ADMISSION:  05/09/2007  DATE OF DISCHARGE:  05/11/2007                               DISCHARGE SUMMARY   PRIMARY CARDIOLOGIST:  [name], M.D.   PRIMARY CARE PHYSICIAN:  Karie Schwalbe, M.D.   DISCHARGE DIAGNOSES:  1. Coronary artery disease with periprocedural non-ST elevation      myocardial infarction.  2. Hypertension.  3. Hyperlipidemia.   ALLERGIES:  No known drug allergies.   PROCEDURE:  Left heart cardiac catheterization with successful PCI and  stenting of the left circumflex with a 2.5 x 12 mm Endeavor drug-eluting  stenting and PCI and stenting of the proximal LAD with 3.0 x 16 mm  Endeavor drug-eluting stenting.   HISTORY OF PRESENT ILLNESS:  A 65 year old male who recently saw Dr.  Excell Seltzer in the clinic on May 08, 2007, with a 6-day history of exertional  substernal chest discomfort with radiation to the jaw.  As a result of  these symptoms, arrangements were made for outpatient cardiac  catheterization.   HOSPITAL COURSE:  The patient presented to the catheterization lab on  June 4, where diagnostic catheterization revealed a 75% proximal  stenosis in the LAD with a 95% stenosis in the mid circumflex and an  occluded right coronary artery with left to right collaterals filling  the distal vessel.   Attention was then turned to the LAD and circumflex and both lesions  were stented with drug-eluting stents as outlined above.  His EF was 50%  with inferior wall hypokinesis.  Post procedure his cardiac markers  bumped to a peak CK of 137, MB of 16.9, and troponin 1.91.  He denied  any chest discomfort or shortness of breath, but did have an episode of  lightheadedness and weakness with near syncope  after ambulating for the  first time following his catheterization.  This was felt to be vasovagal  in nature.  Because of his elevated enzymes, we kept him an additional  24 hours.  He has had no recurrent chest discomfort or dyspnea and has  been ambulating without difficulty.  We will discharge him today in  satisfactory condition.   DISCHARGE LABORATORY DATA:  Hemoglobin 14.0, hematocrit 40.6, WBC 12.8,  platelets 177, MCV 91.8, neutrophils 68.  Sodium 139, potassium 3.9,  chloride 105, CO2 26, BUN 15, creatinine 0.99, glucose 193.  CK 76, MB  3.9, calcium 9.2.   DISPOSITION:  The patient is being discharged home today in good  condition.   FOLLOWUP:  He will see Dr. Excell Seltzer on June 20, at 9:45 a.m.  He will  follow up with Dr. Alphonsus Sias as previously scheduled.   DISCHARGE MEDICATIONS:  1. Aspirin 325 mg daily.  2. Plavix 75 mg daily.  3. Vytorin 10/20 mg q.h.s.  4. Toprol XL 50 mg daily.  5. Multivitamin one daily.  6. Nitroglycerin 0.4 mg  sublingual p.r.n. chest pain.   OUTSTANDING LAB STUDIES:  None.   DURATION DISCHARGE ENCOUNTER:  35 minutes including physician time.      Nicolasa Ducking, ANP      [name], MD  Electronically Signed    CB/MEDQ  D:  05/11/2007  T:  05/11/2007  Job:  [id number]   cc:   Karie Schwalbe, MD

## 2011-04-22 NOTE — Assessment & Plan Note (Signed)
[facility]                            CARDIOLOGY OFFICE NOTE   Jason Bell, Jason Bell                     MRN:          [id number]  DATE:01/27/2009                            DOB:          May 26, 1946    REASON FOR VISIT:  Coronary artery disease.   HISTORY OF PRESENT ILLNESS:  Jason Bell is a 65 year old gentleman  with the coronary artery disease who initially presented with class III  angina.  He underwent stenting of the proximal LAD and mid left  circumflex in 2008 with drug-eluting stent platforms.  He has a  chronically occluded right coronary artery that is collateralized from  the left.  He underwent a Myoview perfusion scan in June 2008 following  this intervention that demonstrated previous inferior and inferoseptal  infarct with no ischemia with LV EF of 64% and the overall findings were  considered low risk.   The patient has done well from a symptomatic standpoint.  He denies  exertional symptoms.  He specifically denies chest pain, dyspnea,  orthopnea, PND, edema, palpitations, lightheadedness, or syncope.  He  maintains a very moderately active lifestyle, but he has not been as  active with his exercise program over the winter months.   MEDICATIONS:  Multivitamin 1 daily, Plavix 75 mg daily, Vytorin  10/40  mg daily, fish oil 3 capsules daily, aspirin 81 mg daily, metoprolol ER  25 mg daily.   ALLERGIES:  No known drug allergies.   PHYSICAL EXAMINATION:  GENERAL:  The patient is alert and oriented.  In  no acute distress.  VITAL SIGNS:  Weight is 171 pounds, blood pressure 130/100, heart rate  66, respiratory rate 12.  HEENT:  Normal.  NECK:  Normal carotid upstrokes.  No bruits.  JVP normal.  LUNGS:  Clear bilaterally.  HEART:  Regular rate and rhythm.  No murmurs or gallops.  ABDOMEN:  Soft, nontender.  No organomegaly.  EXTREMITIES:  No clubbing, cyanosis, or edema.  Peripheral pulse is  intact and equal.  SKIN:  Warm,  dry without rash.   EKG shows normal sinus rhythm and it is within normal limits.  Heart  rate 66 beats per minute.   ASSESSMENT:  1. Coronary artery disease with previous percutaneous coronary      intervention, June 2008.  The patient is stable with no evidence of      angina.  He has had multivessel stenting.  He would like to      discontinue Plavix due to cost.  He is nearly 2 years out from his      intervention and I think it is certainly reasonable to discontinue      Plavix at this point.  I have asked him increase his aspirin to 162      mg daily.  Continue with low-dose beta blocker as well.  2. Dyslipidemia.  The patient has been on Vytorin.  He requests change      as this is not preferred on his insurance.  He was changed to      Crestor 20 mg daily.  Repeat lipids show a  cholesterol 155, LDL 89,      and HDL 41.  His liver function tests are normal.  We will repeat      lipids and LFTs in 12 weeks since his lipid-lowering therapy has      been changed.  3. For followup, I would like to see Jason Bell in 6 months.  Of      note, his blood pressure was elevated today, but it had been normal      on all of his past visits.  I have asked him to keep an eye on this      as an outpatient as he may require additional antihypertensive      therapy.     [name], MD     MDC/MedQ  DD: 02/04/2009  DT: 02/05/2009  Job #: [id number]

## 2011-04-22 NOTE — Procedures (Signed)
[facility]  Patient:    Jason Bell, Jason Bell                     MRN: [id number] Proc. Date: 05/10/01 Adm. Date:  [id number] Attending:  Sabino Gasser                           Procedure Report  PROCEDURE:  Colonoscopy with hot biopsy.  GASTROENTEROLOGIST:  Sabino Gasser, M.D.  INDICATIONS:  Colon polyp at 20 cm.  ANESTHESIA:  Demerol 75 mg, Versed 8 mg.  PROCEDURE IN DETAIL:  With the patient mildly sedated and in the left lateral decubitus position, rectal examination was performed which was unremarkable. Subsequently, the Olympus video colonoscope was inserted in the rectum and then passed under direct vision to the cecum, identified by ileocecal valve and appendiceal orifice, both of which were photographed.  From this point, the colonoscope was slowly withdrawn, taking circumferential views of the entire colonic mucosa, stopping only in the sigmoid colon where  diverticula were noted and moderately severe, and a polyp was seen at the 20 cm mark, small sessile, slightly erythematous.  It was photographed, and it was removed using hot biopsy forceps technique at setting of 20/20 blended current.  The endoscope was withdrawn to the rectum which appeared normal on direct view, and retroflexed view showed hemorrhoids.  The patients vital signs and pulse oximetry remained stable.  The patient tolerated the procedure well with no apparent complications.  FINDINGS: 1. Diverticulosis of sigmoid colon. 2. Polyp at 20 cm removed. 3. Internal hemorrhoids.  PLAN:  Await biopsy report.  The patient will call me for results and follow up with me as an outpatient. DD:  05/10/01 TD:  05/10/01 Job: [id number]

## 2011-06-20 ENCOUNTER — Other Ambulatory Visit: Payer: Self-pay | Admitting: Cardiovascular Disease

## 2011-06-28 ENCOUNTER — Encounter: Payer: Self-pay | Admitting: Cardiovascular Disease

## 2011-09-22 LAB — BASIC METABOLIC PANEL
BUN: 17
Calcium: 8.5
Calcium: 9.2
Creatinine, Ser: 1.05
GFR calc Af Amer: >60
GFR calc Af Amer: >60
GFR calc non Af Amer: >60
Glucose, Bld: 103 — ABNORMAL HIGH
Sodium: 139
Sodium: 139

## 2011-09-22 LAB — DIFFERENTIAL
Basophils Relative: 0
Lymphocytes Relative: 23
Lymphs Abs: 2.9
Monocytes Absolute: 1 — ABNORMAL HIGH
Monocytes Relative: 8
Neutro Abs: 8.7 — ABNORMAL HIGH
Neutrophils Relative %: 68

## 2011-09-22 LAB — CBC
Hemoglobin: 14
Hemoglobin: 14.2
MCHC: 33.7
MCHC: 34.4
MCV: 92.8
Platelets: 177
RBC: 4.43
RBC: 4.53
RDW: 13.1
WBC: 12.8 — ABNORMAL HIGH
WBC: 13.6 — ABNORMAL HIGH

## 2011-09-22 LAB — TROPONIN I: Troponin I: 0.43 — ABNORMAL HIGH

## 2011-09-22 LAB — CK TOTAL AND CKMB (NOT AT ARMC)
CK, MB: 3.9
Relative Index: INVALID
Relative Index: INVALID
Total CK: 76
Total CK: 79

## 2011-09-22 LAB — CARDIAC PANEL(CRET KIN+CKTOT+MB+TROPI)
CK, MB: 16.9 — ABNORMAL HIGH
Relative Index: 12.3 — ABNORMAL HIGH
Total CK: 137

## 2011-11-03 ENCOUNTER — Encounter: Payer: Self-pay | Admitting: Cardiovascular Disease

## 2011-11-04 ENCOUNTER — Encounter: Payer: Self-pay | Admitting: Cardiovascular Disease

## 2011-11-04 ENCOUNTER — Ambulatory Visit (INDEPENDENT_AMBULATORY_CARE_PROVIDER_SITE_OTHER): Payer: 59 | Admitting: Cardiovascular Disease

## 2011-11-04 VITALS — BP 142/78 | HR 89 | Ht 65.0 in | Wt 173.4 lb

## 2011-11-04 DIAGNOSIS — I2510 Atherosclerotic heart disease of native coronary artery without angina pectoris: Secondary | ICD-10-CM

## 2011-11-04 DIAGNOSIS — E785 Hyperlipidemia, unspecified: Secondary | ICD-10-CM

## 2011-11-04 DIAGNOSIS — I10 Essential (primary) hypertension: Secondary | ICD-10-CM

## 2011-11-04 NOTE — Patient Instructions (Signed)
Your physician wants you to follow-up in: one year You will receive a reminder letter in the mail two months in advance. If you don't receive a letter, please call our office to schedule the follow-up appointment.  

## 2011-11-04 NOTE — Assessment & Plan Note (Signed)
The patient is on simvastatin 40 mg at bedtime. His lipids are followed by Dr. Earl Gala.

## 2011-11-04 NOTE — Assessment & Plan Note (Signed)
The patient is stable without angina. His medications are unchanged over time and he is tolerating them well. He really needs to focus on diet and an exercise program. We discussed this and he plans on initiating an exercise program leading to his retirement from work.

## 2011-11-04 NOTE — Progress Notes (Signed)
HPI:  Jason Bell returns for followup of his coronary artery disease.  He has multivessel CAD and underwent stenting of the proximal LAD and mid left circumflex back in 2008 with drug-eluting stent platforms. The patient has a total occlusion of the right coronary artery that is collateralized from the left. He is stable without angina at this point. He specifically denies chest pain or pressure with activity. He has not engaged in regular physical exercise but he remains active with his work. He denies dyspnea, orthopnea, or PND. He has developed cataracts and will probably require surgery next year. He is planning on retiring next spring.  Outpatient Encounter Prescriptions as of 11/04/2011  Medication Sig Dispense Refill  . aspirin 81 MG tablet Take 81 mg by mouth 2 (two) times daily.       . metoprolol (TOPROL-XL) 50 MG 24 hr tablet Take 50 mg by mouth daily.        . Multiple Vitamin (MULTIVITAMIN PO) Take by mouth daily.        . Omega-3 Fatty Acids (FISH OIL PO) Take by mouth daily.        . simvastatin (ZOCOR) 80 MG tablet Take 40 mg by mouth at bedtime.        . tadalafil (CIALIS) 20 MG tablet Take 20 mg by mouth daily as needed.        Marland Kitchen DISCONTD: simvastatin (ZOCOR) 80 MG tablet TAKE 1 TABLET EVERY DAY  90 tablet  2    Allergies  Allergen Reactions  . Atorvastatin     Past Medical History  Diagnosis Date  . Coronary artery disease     s/p multivessel PCI 2008  . Hyperlipidemia   . Hypertension   . Congenital nystagmus     ROS: Negative except as per HPI  BP 142/78  Pulse 89  Ht 5\' 5"  (1.651 m)  Wt 78.654 kg (173 lb 6.4 oz)  BMI 28.86 kg/m2  PHYSICAL EXAM: Pt is alert and oriented, NAD HEENT: normal Neck: JVP - normal, carotids 2+= without bruits Lungs: CTA bilaterally CV: RRR without murmur or gallop Abd: soft, NT, Positive BS, no hepatomegaly Ext: no C/C/E, distal pulses intact and equal Skin: warm/dry no rash  EKG:  Normal sinus rhythm 89 beats per minute,  nonspecific ST abnormality.  ASSESSMENT AND PLAN:

## 2011-11-04 NOTE — Assessment & Plan Note (Signed)
Blood pressure is mildly elevated at 142/78. He understands this is probably related to his weight. He will focus on lifestyle modification.

## 2011-11-14 ENCOUNTER — Other Ambulatory Visit: Payer: Self-pay | Admitting: Cardiovascular Disease

## 2012-04-20 ENCOUNTER — Encounter: Payer: Self-pay | Admitting: Cardiovascular Disease

## 2012-04-25 ENCOUNTER — Other Ambulatory Visit: Payer: Self-pay | Admitting: Cardiovascular Disease

## 2012-09-28 ENCOUNTER — Encounter (INDEPENDENT_AMBULATORY_CARE_PROVIDER_SITE_OTHER): Payer: Medicare Other | Admitting: Ophthalmology

## 2012-09-28 DIAGNOSIS — H35039 Hypertensive retinopathy, unspecified eye: Secondary | ICD-10-CM

## 2012-09-28 DIAGNOSIS — H26499 Other secondary cataract, unspecified eye: Secondary | ICD-10-CM

## 2012-09-28 DIAGNOSIS — H3530 Unspecified macular degeneration: Secondary | ICD-10-CM

## 2012-09-28 DIAGNOSIS — I10 Essential (primary) hypertension: Secondary | ICD-10-CM

## 2012-09-28 DIAGNOSIS — H43819 Vitreous degeneration, unspecified eye: Secondary | ICD-10-CM

## 2012-10-15 ENCOUNTER — Encounter (INDEPENDENT_AMBULATORY_CARE_PROVIDER_SITE_OTHER): Payer: Medicare Other | Admitting: Ophthalmology

## 2012-10-15 DIAGNOSIS — H26499 Other secondary cataract, unspecified eye: Secondary | ICD-10-CM

## 2012-10-16 ENCOUNTER — Encounter: Payer: Self-pay | Admitting: Cardiovascular Disease

## 2012-10-16 ENCOUNTER — Ambulatory Visit (INDEPENDENT_AMBULATORY_CARE_PROVIDER_SITE_OTHER): Payer: Medicare Other | Admitting: Cardiovascular Disease

## 2012-10-16 VITALS — BP 126/78 | HR 73 | Ht 63.5 in | Wt 172.4 lb

## 2012-10-16 DIAGNOSIS — I2510 Atherosclerotic heart disease of native coronary artery without angina pectoris: Secondary | ICD-10-CM

## 2012-10-16 NOTE — Progress Notes (Signed)
   HPI:  3 her old gentleman presenting for followup evaluation. The patient has multivessel coronary artery disease and underwent stenting of the proximal LAD in mid left circumflex in 2008. He has chronic total occlusion of the right coronary artery collateralized from the left. He presents today for followup evaluation.  Amond retired about 6 months ago. He feels well. She's not been engaged in any regular exercise. He denies chest pain or pressure, dyspnea, edema, palpitations, lightheadedness, or syncope.  He's had some problems with his eyes and has undergone 4 different surgeries, now with good improvement in his vision. He otherwise has had an uneventful last 12 months.  Outpatient Encounter Prescriptions as of 10/16/2012  Medication Sig Dispense Refill  . aspirin 81 MG tablet Take 81 mg by mouth 2 (two) times daily.       Marland Kitchen CIALIS 20 MG tablet TAKE ONE TABLET BY MOUTH AS NEEDED DAILY  12 tablet  2  . metoprolol (TOPROL-XL) 50 MG 24 hr tablet TAKE 1 TABLET EVERY DAY  90 tablet  3  . Multiple Vitamin (MULTIVITAMIN PO) Take by mouth daily.        . Omega-3 Fatty Acids (FISH OIL PO) Take by mouth daily.        . simvastatin (ZOCOR) 80 MG tablet TAKE 1 TABLET EVERY DAY  90 tablet  2  . [DISCONTINUED] simvastatin (ZOCOR) 80 MG tablet Take 40 mg by mouth at bedtime.          Allergies  Allergen Reactions  . Atorvastatin     Past Medical History  Diagnosis Date  . Coronary artery disease     s/p multivessel PCI 2008  . Hyperlipidemia   . Hypertension   . Congenital nystagmus     ROS: Negative except as per HPI  BP 126/78  Pulse 73  Ht 5' 3.5" (1.613 m)  Wt 78.2 kg (172 lb 6.4 oz)  BMI 30.06 kg/m2  SpO2 95%  PHYSICAL EXAM: Pt is alert and oriented, NAD HEENT: normal Neck: JVP - normal, carotids 2+= without bruits Lungs: CTA bilaterally CV: RRR without murmur or gallop Abd: soft, NT, Positive BS, no hepatomegaly Ext: no C/C/E, distal pulses intact and equal Skin:  warm/dry no rash  EKG:  Normal sinus rhythm 74 beats per minute, within normal limits.  ASSESSMENT AND PLAN: 1. Coronary artery disease, native vessel. The patient is stable without anginal symptoms. He appears to have good control of his cardiovascular risk factors. He remains on aspirin 81 mg daily, a statin drug, and the beta blocker. I will see him back in 12 months.  2. Hypercholesterolemia. The patient is on high dose simvastatin as tolerated this for some time. Dr. Earl Gala follows his labs on a yearly basis. We will request a copy of most recent labs (last copy on file from 06/28/11).  For follow-up will see him back in 12 months.  Tonny Bollman 10/16/2012 12:11 PM

## 2012-10-16 NOTE — Patient Instructions (Addendum)
Your physician wants you to follow-up in: 1 YEAR with Dr Cooper.  You will receive a reminder letter in the mail two months in advance. If you don't receive a letter, please call our office to schedule the follow-up appointment.  Your physician recommends that you continue on your current medications as directed. Please refer to the Current Medication list given to you today.  

## 2012-10-25 ENCOUNTER — Ambulatory Visit (INDEPENDENT_AMBULATORY_CARE_PROVIDER_SITE_OTHER): Payer: Medicare Other | Admitting: Ophthalmology

## 2012-10-25 DIAGNOSIS — H2700 Aphakia, unspecified eye: Secondary | ICD-10-CM

## 2012-12-04 ENCOUNTER — Other Ambulatory Visit: Payer: Self-pay | Admitting: Cardiovascular Disease

## 2013-04-25 ENCOUNTER — Encounter: Payer: Self-pay | Admitting: Cardiovascular Disease

## 2013-07-29 ENCOUNTER — Other Ambulatory Visit: Payer: Self-pay | Admitting: Cardiovascular Disease

## 2013-10-16 ENCOUNTER — Encounter: Payer: Self-pay | Admitting: Cardiovascular Disease

## 2013-10-16 ENCOUNTER — Ambulatory Visit (INDEPENDENT_AMBULATORY_CARE_PROVIDER_SITE_OTHER): Payer: Medicare Other | Admitting: Cardiovascular Disease

## 2013-10-16 VITALS — BP 122/88 | HR 76 | Ht 63.5 in | Wt 159.0 lb

## 2013-10-16 DIAGNOSIS — I10 Essential (primary) hypertension: Secondary | ICD-10-CM

## 2013-10-16 DIAGNOSIS — E785 Hyperlipidemia, unspecified: Secondary | ICD-10-CM

## 2013-10-16 DIAGNOSIS — I2510 Atherosclerotic heart disease of native coronary artery without angina pectoris: Secondary | ICD-10-CM

## 2013-10-16 NOTE — Patient Instructions (Signed)
Your physician wants you to follow-up in: 1 YEAR with Dr Cooper.  You will receive a reminder letter in the mail two months in advance. If you don't receive a letter, please call our office to schedule the follow-up appointment.  Your physician recommends that you continue on your current medications as directed. Please refer to the Current Medication list given to you today.  

## 2013-10-16 NOTE — Progress Notes (Signed)
    HPI:  67 year old gentleman presenting for followup evaluation. The patient has been followed for coronary artery disease. He has multivessel disease and was treated with stenting of the LAD and left circumflex in 2008. He has a chronic total occlusion of the right coronary artery collateralized from the left. He's done well with no angina in recent years. He retired last year and is more physically active than he has been in the past. He is currently working on a suspension ceiling at his house. He's lost over 10 pounds since his last visit one year ago. He denies chest pain, chest pressure, shortness of breath, leg swelling, lightheadedness, or palpitations.  Outpatient Encounter Prescriptions as of 10/16/2013  Medication Sig  . aspirin 81 MG tablet Take 81 mg by mouth 2 (two) times daily.   Marland Kitchen CIALIS 20 MG tablet TAKE ONE TABLET BY MOUTH AS NEEDED DAILY  . metoprolol succinate (TOPROL-XL) 50 MG 24 hr tablet TAKE 1 TABLET EVERY DAY  . Multiple Vitamin (MULTIVITAMIN PO) Take by mouth daily.    . Omega-3 Fatty Acids (FISH OIL PO) Take by mouth daily.    . simvastatin (ZOCOR) 80 MG tablet TAKE 1 TABLET EVERY DAY    Allergies  Allergen Reactions  . Atorvastatin     Past Medical History  Diagnosis Date  . Coronary artery disease     s/p multivessel PCI 2008  . Hyperlipidemia   . Hypertension   . Congenital nystagmus     ROS: Negative except as per HPI  BP 122/88  Pulse 76  Ht 5' 3.5" (1.613 m)  Wt 159 lb (72.122 kg)  BMI 27.72 kg/m2  PHYSICAL EXAM: Pt is alert and oriented, NAD HEENT: normal Neck: JVP - normal, carotids 2+= without bruits Lungs: CTA bilaterally CV: RRR without murmur or gallop Abd: soft, NT, Positive BS, no hepatomegaly Ext: no C/C/E, distal pulses intact and equal Skin: warm/dry no rash  EKG:  Normal sinus rhythm, within normal limits.  ASSESSMENT AND PLAN: 1. Coronary atherosclerosis, native vessel. The patient is stable without anginal symptoms.  His medical program includes aspirin, a beta blocker, and a statin drug.  2. Hypertension. Blood pressure is controlled on metoprolol succinate.  3. Hyperlipidemia. Lipids are followed by Dr. Earl Gala. His cholesterol was 160, triglycerides 110, HDL 40, and LDL 99. He is maintained on high dose simvastatin. We reviewed the importance of establishing a regular exercise program and continue to focus on diet and weight loss

## 2014-04-23 ENCOUNTER — Other Ambulatory Visit: Payer: Self-pay | Admitting: Cardiovascular Disease

## 2014-05-01 ENCOUNTER — Encounter: Payer: Self-pay | Admitting: Cardiovascular Disease

## 2014-06-07 ENCOUNTER — Other Ambulatory Visit: Payer: Self-pay | Admitting: Cardiovascular Disease

## 2014-07-24 ENCOUNTER — Other Ambulatory Visit: Payer: Self-pay | Admitting: Cardiovascular Disease

## 2014-10-17 ENCOUNTER — Ambulatory Visit (INDEPENDENT_AMBULATORY_CARE_PROVIDER_SITE_OTHER): Payer: Medicare Other | Admitting: Nurse Practitioner

## 2014-10-17 ENCOUNTER — Encounter: Payer: Self-pay | Admitting: Nurse Practitioner

## 2014-10-17 VITALS — BP 130/84 | HR 56 | Ht 63.0 in | Wt 183.8 lb

## 2014-10-17 DIAGNOSIS — E785 Hyperlipidemia, unspecified: Secondary | ICD-10-CM

## 2014-10-17 DIAGNOSIS — I10 Essential (primary) hypertension: Secondary | ICD-10-CM

## 2014-10-17 DIAGNOSIS — I2510 Atherosclerotic heart disease of native coronary artery without angina pectoris: Secondary | ICD-10-CM

## 2014-10-17 MED ORDER — METOPROLOL SUCCINATE ER 50 MG PO TB24: ORAL_TABLET | ORAL | Status: DC

## 2014-10-17 MED ORDER — TADALAFIL 20 MG PO TABS: ORAL_TABLET | ORAL | Status: DC

## 2014-10-17 MED ORDER — SIMVASTATIN 80 MG PO TABS: ORAL_TABLET | ORAL | Status: DC
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
## 2014-10-17 NOTE — Patient Instructions (Addendum)
Stay on your current medicines -  I have refilled them today  See Dr. Excell Seltzerooper in one year  Call the [facility] office at [phone] if you have any questions, problems or concerns.

## 2014-10-22 ENCOUNTER — Telehealth: Payer: Self-pay | Admitting: *Deleted

## 2014-10-22 NOTE — Telephone Encounter (Signed)
[facility] called and stated that the cialis is going to cost the patient $519.00 for 12 pills. She was wanting to see if Dr Excell Seltzerooper would be willing to switch the patient to viagra 20mg , which the patient can get 90 pills for only $66.00. Please advise. Thanks, MI

## 2014-10-22 NOTE — Telephone Encounter (Signed)
That's fine thx

## 2014-10-23 ENCOUNTER — Other Ambulatory Visit: Payer: Self-pay | Admitting: *Deleted

## 2014-10-23 MED ORDER — SILDENAFIL CITRATE 20 MG PO TABS: 100.0000 mg | ORAL_TABLET | Freq: Every day | ORAL | Status: DC | PRN

## 2014-10-23 NOTE — Telephone Encounter (Signed)
Viagra does not come in a 20mg , she was actually referring to sildenafil (revatio) but she stated that is was like a generic form of viagra. Is this ok? If so please advise on sig. Thanks, MI

## 2014-10-23 NOTE — Telephone Encounter (Signed)
Pt was given a Rx for Cialis 20mg  prn. I spoke with Baxter HireKristen Pharm-D in our Anticoagulation clinic and she said the comparison dosage in Viagra is  100mg . The pt can be given a Rx for Sildenafil 20mg  take 5 tablets prior to sexual activity prn.

## 2015-04-27 ENCOUNTER — Telehealth: Payer: Self-pay | Admitting: Cardiovascular Disease

## 2015-04-27 NOTE — Telephone Encounter (Signed)
New Message   Pt c/o BP issue: STAT if pt c/o blurred vision, one-sided weakness or slurred speech  1. What are your last 5 BP readings? 128/86, 75:22. 147/87, 76P: 134/82, 78: 141/92, 77P; 145/93, 88P (177/103, 119) 1224Pm on 04/25/15  2. Are you having any other symptoms (ex. Dizziness, headache, blurred vision, passed out)? Had early Saturday am an anxiety attack;  And has a headache rt now   3. What is your BP issue? Rapid heart beat and spiking of BP

## 2015-04-27 NOTE — Telephone Encounter (Signed)
Agreed thx 

## 2015-04-27 NOTE — Telephone Encounter (Signed)
I spoke with the Jason Bell and he is concerned about his BP and pulse fluctuating. He has been monitoring his readings for the past week and a half and has noticed that his BP increases to the 150/90 range with exertion. On Saturday morning the Jason Bell woke up early and started worrying about his BP and had a "panic attack". He developed tightness in his chest and heart rate increased, this lasted about a minute and he developed a dull headache. Saturday afternoon he was watching a movie when he got real warm and his BP when up to 177/103 and pulse 119.  He applied a cold cloth to his head and things settled down. Yesterday and today the Jason Bell has not had any problems.  The Jason Bell's BP this morning was 128/83.  The Jason Bell said he got up and fed his cat and his BP went to 144/91. I made the Jason Bell aware that he needs to limit checking his BP.  The Jason Bell also has not been active during the winter months and he plans to begin exercising again.  I advised him to start with walking and increase length of exercise over time. We also discussed limiting sodium in his diet (Saturday he ate a can of [employer] soup for lunch). The Jason Bell will contact the office with any other questions or concerns.

## 2015-06-11 ENCOUNTER — Other Ambulatory Visit: Payer: Self-pay | Admitting: Gastroenterology

## 2015-07-10 ENCOUNTER — Other Ambulatory Visit: Payer: Self-pay | Admitting: *Deleted

## 2015-07-10 MED ORDER — METOPROLOL SUCCINATE ER 50 MG PO TB24
ORAL_TABLET | ORAL | Status: DC
Start: 2015-07-10 — End: 2015-12-03

## 2015-09-02 ENCOUNTER — Encounter (HOSPITAL_COMMUNITY): Payer: Self-pay | Admitting: *Deleted

## 2015-09-08 ENCOUNTER — Other Ambulatory Visit: Payer: Self-pay | Admitting: Gastroenterology

## 2015-09-13 NOTE — Anesthesia Preprocedure Evaluation (Addendum)
Anesthesia Evaluation  Patient identified by MRN, date of birth, ID band Patient awake    Reviewed: Allergy & Precautions, H&P , NPO status , Patient's Chart, lab work & pertinent test results, reviewed documented beta blocker date and time   Airway Mallampati: I  TM Distance: >3 FB Neck ROM: Full    Dental no notable dental hx. (+) Teeth Intact, Dental Advisory Given   Pulmonary neg pulmonary ROS,    Pulmonary exam normal breath sounds clear to auscultation       Cardiovascular hypertension, Pt. on medications and Pt. on home beta blockers + CAD and + Cardiac Stents   Rhythm:Regular Rate:Normal     Neuro/Psych negative neurological ROS  negative psych ROS   GI/Hepatic negative GI ROS, Neg liver ROS,   Endo/Other  negative endocrine ROS  Renal/GU negative Renal ROS  negative genitourinary   Musculoskeletal   Abdominal   Peds  Hematology negative hematology ROS (+)   Anesthesia Other Findings   Reproductive/Obstetrics negative OB ROS                           Anesthesia Physical Anesthesia Plan  ASA: III  Anesthesia Plan: MAC   Post-op Pain Management:    Induction: Intravenous  Airway Management Planned: Simple Face Mask  Additional Equipment:   Intra-op Plan:   Post-operative Plan:   Informed Consent: I have reviewed the patients History and Physical, chart, labs and discussed the procedure including the risks, benefits and alternatives for the proposed anesthesia with the patient or authorized representative who has indicated his/her understanding and acceptance.   Dental advisory given  Plan Discussed with: CRNA  Anesthesia Plan Comments:        Anesthesia Quick Evaluation

## 2015-09-14 ENCOUNTER — Ambulatory Visit (HOSPITAL_COMMUNITY)
Admission: RE | Admit: 2015-09-14 | Discharge: 2015-09-14 | Disposition: A | Discharge status: Home / Self Care | Payer: Medicare Other | Source: Ambulatory Visit | Attending: Gastroenterology | Admitting: Gastroenterology

## 2015-09-14 ENCOUNTER — Encounter (HOSPITAL_COMMUNITY): Payer: Self-pay

## 2015-09-14 ENCOUNTER — Ambulatory Visit (HOSPITAL_COMMUNITY): Payer: Medicare Other | Admitting: Anesthesiology

## 2015-09-14 ENCOUNTER — Encounter (HOSPITAL_COMMUNITY)
Admission: RE | Disposition: A | Discharge status: Home / Self Care | Payer: Self-pay | Source: Ambulatory Visit | Attending: Gastroenterology

## 2015-09-14 DIAGNOSIS — K5730 Diverticulosis of large intestine without perforation or abscess without bleeding: Secondary | ICD-10-CM | POA: Diagnosis not present

## 2015-09-14 DIAGNOSIS — E7800 Pure hypercholesterolemia, unspecified: Secondary | ICD-10-CM | POA: Diagnosis not present

## 2015-09-14 DIAGNOSIS — M1990 Unspecified osteoarthritis, unspecified site: Secondary | ICD-10-CM | POA: Insufficient documentation

## 2015-09-14 DIAGNOSIS — I2510 Atherosclerotic heart disease of native coronary artery without angina pectoris: Secondary | ICD-10-CM | POA: Insufficient documentation

## 2015-09-14 DIAGNOSIS — I10 Essential (primary) hypertension: Secondary | ICD-10-CM | POA: Insufficient documentation

## 2015-09-14 DIAGNOSIS — Z86010 Personal history of colonic polyps: Secondary | ICD-10-CM | POA: Insufficient documentation

## 2015-09-14 DIAGNOSIS — Z955 Presence of coronary angioplasty implant and graft: Secondary | ICD-10-CM | POA: Diagnosis not present

## 2015-09-14 DIAGNOSIS — Z9849 Cataract extraction status, unspecified eye: Secondary | ICD-10-CM | POA: Diagnosis not present

## 2015-09-14 DIAGNOSIS — Z1211 Encounter for screening for malignant neoplasm of colon: Secondary | ICD-10-CM | POA: Diagnosis not present

## 2015-09-14 HISTORY — PX: COLONOSCOPY WITH PROPOFOL: SHX5780

## 2015-09-14 SURGERY — COLONOSCOPY WITH PROPOFOL
Anesthesia: Monitor Anesthesia Care

## 2015-09-14 MED ORDER — LIDOCAINE HCL (PF) 2 % IJ SOLN
INTRAMUSCULAR | Status: DC | PRN
  Administered 2015-09-14: 20 mg via INTRADERMAL

## 2015-09-14 MED ORDER — PROPOFOL 10 MG/ML IV BOLUS
INTRAVENOUS | Status: AC
  Filled 2015-09-14: qty 20

## 2015-09-14 MED ORDER — LACTATED RINGERS IV SOLN
INTRAVENOUS | Status: DC
Start: 2015-09-14 — End: 2015-09-16
  Administered 2015-09-14: 11:00:00 via INTRAVENOUS

## 2015-09-14 MED ORDER — SODIUM CHLORIDE 0.9 % IV SOLN: INTRAVENOUS | Status: DC

## 2015-09-14 MED ORDER — FENTANYL CITRATE (PF) 100 MCG/2ML IJ SOLN: 25.0000 ug | INTRAMUSCULAR | Status: DC | PRN

## 2015-09-14 MED ORDER — PROPOFOL 10 MG/ML IV BOLUS
INTRAVENOUS | Status: DC | PRN
  Administered 2015-09-14: 100 mg via INTRAVENOUS
  Administered 2015-09-14 (×2): 50 mg via INTRAVENOUS

## 2015-09-14 SURGICAL SUPPLY — 22 items

## 2015-09-14 NOTE — Transfer of Care (Signed)
Immediate Anesthesia Transfer of Care Note  Patient: Jason Bell  Procedure(s) Performed: Procedure(s): COLONOSCOPY WITH PROPOFOL (N/A)  Patient Location: PACU  Anesthesia Type:MAC  Level of Consciousness:  sedated, patient cooperative and responds to stimulation  Airway & Oxygen Therapy:Patient Spontanous Breathing and Patient connected to face mask oxgen  Post-op Assessment:  Report given to PACU RN and Post -op Vital signs reviewed and stable  Post vital signs:  Reviewed and stable  Last Vitals:  Filed Vitals:   09/14/15 1030  BP: 130/88  Pulse: 67  Temp: 36.7 C  Resp: 10    Complications: No apparent anesthesia complications
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
## 2015-09-14 NOTE — Discharge Instructions (Signed)

## 2015-09-14 NOTE — Op Note (Signed)
Procedure: Surveillance colonoscopy. Normal surveillance colonoscopy performed on 05/31/2010. Adenomatous colon polyps removed colonoscopically in the past.  Endoscopist: Danise Edge  Premedication: Propofol administered by anesthesia  Procedure: The patient was placed in the left lateral decubitus position. Anal inspection and digital rectal exam were normal. The Pentax pediatric colonoscope was introduced into the rectum and advanced to the cecum. A normal-appearing ileocecal valve and appendiceal orifice were identified. Colonic preparation for the exam today was satisfactory. Withdrawal time was 12 minutes  Rectum. Normal. Retroflexed view of the distal rectum was normal.  Sigmoid colon and descending colon. Left colonic diverticulosis  Splenic flexure. Normal  Transverse colon. Normal  Hepatic flexure. Normal  Ascending colon. Normal  Cecum and ileocecal valve. Normal  Assessment: Normal surveillance colonoscopy.

## 2015-09-14 NOTE — Anesthesia Postprocedure Evaluation (Signed)
  Anesthesia Post-op Note  Patient: Jason Bell  Procedure(s) Performed: Procedure(s): COLONOSCOPY WITH PROPOFOL (N/A)  Patient Location: PACU  Anesthesia Type: MAC  Level of Consciousness: awake and alert   Airway and Oxygen Therapy: Patient Spontanous Breathing  Post-op Pain: Controlled  Post-op Assessment: Post-op Vital signs reviewed, Patient's Cardiovascular Status Stable and Respiratory Function Stable  Post-op Vital Signs: Reviewed  Filed Vitals:   09/14/15 1215  BP:   Pulse: 68  Temp:   Resp: 19    Complications: No apparent anesthesia complications

## 2015-09-15 ENCOUNTER — Encounter (HOSPITAL_COMMUNITY): Payer: Self-pay | Admitting: Gastroenterology

## 2015-12-03 ENCOUNTER — Ambulatory Visit (INDEPENDENT_AMBULATORY_CARE_PROVIDER_SITE_OTHER): Payer: Medicare Other | Admitting: Cardiovascular Disease

## 2015-12-03 ENCOUNTER — Encounter: Payer: Self-pay | Admitting: Cardiovascular Disease

## 2015-12-03 VITALS — BP 120/90 | HR 70 | Ht 63.5 in | Wt 176.8 lb

## 2015-12-03 DIAGNOSIS — E785 Hyperlipidemia, unspecified: Secondary | ICD-10-CM

## 2015-12-03 DIAGNOSIS — I2510 Atherosclerotic heart disease of native coronary artery without angina pectoris: Secondary | ICD-10-CM

## 2015-12-03 DIAGNOSIS — I10 Essential (primary) hypertension: Secondary | ICD-10-CM | POA: Diagnosis not present
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
## 2015-12-03 NOTE — Patient Instructions (Signed)

## 2016-03-07 ENCOUNTER — Other Ambulatory Visit: Payer: Self-pay | Admitting: *Deleted

## 2016-03-07 MED ORDER — SIMVASTATIN 80 MG PO TABS: 40.0000 mg | ORAL_TABLET | Freq: Every day | ORAL | Status: DC

## 2016-05-13 DIAGNOSIS — I25119 Atherosclerotic heart disease of native coronary artery with unspecified angina pectoris: Secondary | ICD-10-CM | POA: Diagnosis not present

## 2016-05-13 DIAGNOSIS — Z1389 Encounter for screening for other disorder: Secondary | ICD-10-CM | POA: Diagnosis not present

## 2016-05-13 DIAGNOSIS — I10 Essential (primary) hypertension: Secondary | ICD-10-CM | POA: Diagnosis not present

## 2016-05-13 DIAGNOSIS — Z0000 Encounter for general adult medical examination without abnormal findings: Secondary | ICD-10-CM | POA: Diagnosis not present

## 2016-05-13 DIAGNOSIS — Z86010 Personal history of colonic polyps: Secondary | ICD-10-CM | POA: Diagnosis not present

## 2016-05-13 DIAGNOSIS — E7800 Pure hypercholesterolemia, unspecified: Secondary | ICD-10-CM | POA: Diagnosis not present

## 2016-05-13 DIAGNOSIS — Z125 Encounter for screening for malignant neoplasm of prostate: Secondary | ICD-10-CM | POA: Diagnosis not present

## 2016-08-29 ENCOUNTER — Other Ambulatory Visit: Payer: Self-pay | Admitting: Cardiovascular Disease

## 2016-08-29 MED ORDER — METOPROLOL SUCCINATE ER 50 MG PO TB24: 50.0000 mg | ORAL_TABLET | Freq: Every evening | ORAL | 2 refills | Status: DC

## 2016-09-01 DIAGNOSIS — Z23 Encounter for immunization: Secondary | ICD-10-CM | POA: Diagnosis not present

## 2016-10-29 ENCOUNTER — Other Ambulatory Visit: Payer: Self-pay | Admitting: Cardiovascular Disease

## 2016-12-24 ENCOUNTER — Other Ambulatory Visit: Payer: Self-pay | Admitting: Cardiovascular Disease

## 2016-12-27 NOTE — Telephone Encounter (Signed)
Please give 2 refills since the pt has an upcoming appointment with Dr Excell Seltzerooper. Thank you

## 2016-12-27 NOTE — Telephone Encounter (Signed)
Okay to refill? Last lipid panel in epic is from 2015 and per last office visit hyperlipidemia is followed by pcp. Please advise. Thanks, MI

## 2017-01-11 ENCOUNTER — Encounter: Payer: Self-pay | Admitting: Cardiovascular Disease

## 2017-01-11 ENCOUNTER — Encounter (INDEPENDENT_AMBULATORY_CARE_PROVIDER_SITE_OTHER): Payer: Self-pay

## 2017-01-11 ENCOUNTER — Ambulatory Visit (INDEPENDENT_AMBULATORY_CARE_PROVIDER_SITE_OTHER): Payer: Medicare Other | Admitting: Cardiovascular Disease

## 2017-01-11 VITALS — BP 132/70 | HR 68 | Ht 63.5 in | Wt 180.8 lb

## 2017-01-11 DIAGNOSIS — E785 Hyperlipidemia, unspecified: Secondary | ICD-10-CM | POA: Diagnosis not present

## 2017-01-11 DIAGNOSIS — I2510 Atherosclerotic heart disease of native coronary artery without angina pectoris: Secondary | ICD-10-CM

## 2017-01-11 DIAGNOSIS — I10 Essential (primary) hypertension: Secondary | ICD-10-CM | POA: Diagnosis not present
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
## 2017-01-11 NOTE — Patient Instructions (Signed)

## 2017-01-31 ENCOUNTER — Other Ambulatory Visit: Payer: Self-pay | Admitting: Cardiovascular Disease

## 2017-06-23 ENCOUNTER — Other Ambulatory Visit: Payer: Self-pay | Admitting: Cardiovascular Disease

## 2017-07-05 DIAGNOSIS — Z0000 Encounter for general adult medical examination without abnormal findings: Secondary | ICD-10-CM | POA: Diagnosis not present

## 2017-07-05 DIAGNOSIS — I25119 Atherosclerotic heart disease of native coronary artery with unspecified angina pectoris: Secondary | ICD-10-CM | POA: Diagnosis not present

## 2017-07-05 DIAGNOSIS — Z86010 Personal history of colonic polyps: Secondary | ICD-10-CM | POA: Diagnosis not present

## 2017-07-05 DIAGNOSIS — Z1389 Encounter for screening for other disorder: Secondary | ICD-10-CM | POA: Diagnosis not present

## 2017-08-17 DIAGNOSIS — R946 Abnormal results of thyroid function studies: Secondary | ICD-10-CM | POA: Diagnosis not present

## 2017-08-17 DIAGNOSIS — I25119 Atherosclerotic heart disease of native coronary artery with unspecified angina pectoris: Secondary | ICD-10-CM | POA: Diagnosis not present

## 2017-08-31 DIAGNOSIS — H526 Other disorders of refraction: Secondary | ICD-10-CM | POA: Diagnosis not present

## 2017-09-13 DIAGNOSIS — Z23 Encounter for immunization: Secondary | ICD-10-CM | POA: Diagnosis not present

## 2017-12-29 ENCOUNTER — Other Ambulatory Visit: Payer: Self-pay | Admitting: Cardiovascular Disease

## 2018-02-07 ENCOUNTER — Encounter: Payer: Self-pay | Admitting: Cardiovascular Disease

## 2018-02-07 ENCOUNTER — Ambulatory Visit: Payer: Medicare Other | Admitting: Cardiovascular Disease

## 2018-02-07 VITALS — BP 124/82 | HR 62 | Ht 63.5 in | Wt 196.6 lb

## 2018-02-07 DIAGNOSIS — E785 Hyperlipidemia, unspecified: Secondary | ICD-10-CM | POA: Diagnosis not present

## 2018-02-07 DIAGNOSIS — I2510 Atherosclerotic heart disease of native coronary artery without angina pectoris: Secondary | ICD-10-CM | POA: Diagnosis not present

## 2018-02-07 MED ORDER — METOPROLOL SUCCINATE ER 50 MG PO TB24: ORAL_TABLET | ORAL | 3 refills | Status: DC

## 2018-02-07 NOTE — Patient Instructions (Signed)
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
## 2018-03-27 ENCOUNTER — Other Ambulatory Visit: Payer: Self-pay | Admitting: Cardiovascular Disease

## 2018-07-26 DIAGNOSIS — Z0000 Encounter for general adult medical examination without abnormal findings: Secondary | ICD-10-CM | POA: Diagnosis not present

## 2018-07-26 DIAGNOSIS — Z1389 Encounter for screening for other disorder: Secondary | ICD-10-CM | POA: Diagnosis not present

## 2018-07-26 DIAGNOSIS — Z125 Encounter for screening for malignant neoplasm of prostate: Secondary | ICD-10-CM | POA: Diagnosis not present

## 2018-07-26 DIAGNOSIS — R739 Hyperglycemia, unspecified: Secondary | ICD-10-CM | POA: Diagnosis not present

## 2018-07-26 DIAGNOSIS — E7800 Pure hypercholesterolemia, unspecified: Secondary | ICD-10-CM | POA: Diagnosis not present

## 2018-08-10 DIAGNOSIS — R7303 Prediabetes: Secondary | ICD-10-CM | POA: Diagnosis not present

## 2018-09-25 DIAGNOSIS — Z23 Encounter for immunization: Secondary | ICD-10-CM | POA: Diagnosis not present

## 2018-12-11 DIAGNOSIS — R7303 Prediabetes: Secondary | ICD-10-CM | POA: Diagnosis not present

## 2019-01-22 ENCOUNTER — Encounter: Payer: Self-pay | Admitting: Cardiovascular Disease

## 2019-02-08 ENCOUNTER — Encounter: Payer: Self-pay | Admitting: Cardiovascular Disease

## 2019-02-08 ENCOUNTER — Ambulatory Visit: Payer: Medicare Other | Admitting: Cardiovascular Disease

## 2019-02-08 VITALS — BP 114/88 | HR 65 | Ht 63.5 in | Wt 172.2 lb

## 2019-02-08 DIAGNOSIS — I2510 Atherosclerotic heart disease of native coronary artery without angina pectoris: Secondary | ICD-10-CM

## 2019-02-08 DIAGNOSIS — I10 Essential (primary) hypertension: Secondary | ICD-10-CM

## 2019-02-08 DIAGNOSIS — E782 Mixed hyperlipidemia: Secondary | ICD-10-CM | POA: Diagnosis not present

## 2019-02-08 MED ORDER — ROSUVASTATIN CALCIUM 20 MG PO TABS: 20.0000 mg | ORAL_TABLET | Freq: Every day | ORAL | 3 refills | Status: DC
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
## 2019-02-08 NOTE — Patient Instructions (Signed)
Medication Instructions:  1) STOP SIMVASTATIN 2) START CRESTOR (rosuvastatin) 20 mg daily If you need a refill on your cardiac medications before your next appointment, please call your pharmacy.   Lab work: Your provider recommends that you return for FASTING lab work in: 3 months.   If you have labs (blood work) drawn today and your tests are completely normal, you will receive your results only by: Marland Kitchen MyChart Message (if you have MyChart) OR . A paper copy in the mail If you have any lab test that is abnormal or we need to change your treatment, we will call you to review the results.  Follow-Up: At [facility], you and your health needs are our priority.  As part of our continuing mission to provide you with exceptional heart care, we have created designated Provider Care Teams.  These Care Teams include your primary Cardiologist (physician) and Advanced Practice Providers (APPs -  Physician Assistants and Nurse Practitioners) who all work together to provide you with the care you need, when you need it. You will need a follow up appointment in:  12 months.  Please call our office 2 months in advance to schedule this appointment.  You may see Tonny Bollman, MD or one of the following Advanced Practice Providers on your designated Care Team: Tereso Newcomer, PA-C [place], [place] . Berton Bon, NP

## 2019-02-09 ENCOUNTER — Other Ambulatory Visit: Payer: Self-pay | Admitting: Cardiovascular Disease

## 2019-05-02 ENCOUNTER — Telehealth: Payer: Self-pay | Admitting: Cardiovascular Disease

## 2019-05-02 NOTE — Telephone Encounter (Signed)
Rescheduled patient's fasting labs to July 8. He was grateful for assistance.

## 2019-05-02 NOTE — Telephone Encounter (Signed)
New Message     Pt is wondering if he should post pone his lab appt     Please call

## 2019-05-10 ENCOUNTER — Other Ambulatory Visit: Payer: Medicare Other

## 2019-06-13 ENCOUNTER — Other Ambulatory Visit: Payer: Self-pay

## 2019-06-13 ENCOUNTER — Encounter (INDEPENDENT_AMBULATORY_CARE_PROVIDER_SITE_OTHER): Payer: Self-pay

## 2019-06-13 ENCOUNTER — Other Ambulatory Visit: Payer: Medicare Other | Admitting: *Deleted

## 2019-06-13 DIAGNOSIS — I2510 Atherosclerotic heart disease of native coronary artery without angina pectoris: Secondary | ICD-10-CM | POA: Diagnosis not present

## 2019-06-13 DIAGNOSIS — E782 Mixed hyperlipidemia: Secondary | ICD-10-CM

## 2019-06-13 LAB — LIPID PANEL
Chol/HDL Ratio: 3.5 ratio (ref 0.0–5.0)
Cholesterol, Total: 159 mg/dL (ref 100–199)
HDL: 46 mg/dL (ref >39)
LDL Calculated: 89 mg/dL (ref 0–99)
Triglycerides: 119 mg/dL (ref 0–149)
VLDL Cholesterol Cal: 24 mg/dL (ref 5–40)

## 2019-06-13 LAB — HEPATIC FUNCTION PANEL
ALT: 17 IU/L (ref 0–44)
AST: 21 IU/L (ref 0–40)
Albumin: 4.3 g/dL (ref 3.7–4.7)
Alkaline Phosphatase: 60 IU/L (ref 39–117)
Bilirubin Total: 0.7 mg/dL (ref 0.0–1.2)
Bilirubin, Direct: 0.18 mg/dL (ref 0.00–0.40)
Total Protein: 6.6 g/dL (ref 6.0–8.5)

## 2019-06-18 ENCOUNTER — Telehealth: Payer: Self-pay

## 2019-06-18 DIAGNOSIS — E782 Mixed hyperlipidemia: Secondary | ICD-10-CM

## 2019-06-18 DIAGNOSIS — I2510 Atherosclerotic heart disease of native coronary artery without angina pectoris: Secondary | ICD-10-CM

## 2019-06-18 DIAGNOSIS — E785 Hyperlipidemia, unspecified: Secondary | ICD-10-CM

## 2019-06-18 DIAGNOSIS — I10 Essential (primary) hypertension: Secondary | ICD-10-CM

## 2019-06-18 MED ORDER — EZETIMIBE 10 MG PO TABS: 10.0000 mg | ORAL_TABLET | Freq: Every day | ORAL | 3 refills | Status: DC

## 2019-06-18 NOTE — Telephone Encounter (Signed)
Pt agrees to start Zetia and will return 09/18/19 for fasting labs.

## 2019-06-18 NOTE — Telephone Encounter (Signed)
LMTCB

## 2019-06-18 NOTE — Telephone Encounter (Signed)
ERROR

## 2019-06-18 NOTE — Telephone Encounter (Signed)
-----   Message from Sherren Mocha, MD sent at 06/16/2019  8:20 AM EDT ----- LDL remains above goal. Please add Zetia 10 mg and repeat labs in 3 months. thanks

## 2019-07-01 DIAGNOSIS — L738 Other specified follicular disorders: Secondary | ICD-10-CM | POA: Diagnosis not present

## 2019-07-01 DIAGNOSIS — D2261 Melanocytic nevi of right upper limb, including shoulder: Secondary | ICD-10-CM | POA: Diagnosis not present

## 2019-08-26 DIAGNOSIS — Z0000 Encounter for general adult medical examination without abnormal findings: Secondary | ICD-10-CM | POA: Diagnosis not present

## 2019-08-26 DIAGNOSIS — H5500 Unspecified nystagmus: Secondary | ICD-10-CM | POA: Diagnosis not present

## 2019-08-26 DIAGNOSIS — E7800 Pure hypercholesterolemia, unspecified: Secondary | ICD-10-CM | POA: Diagnosis not present

## 2019-08-26 DIAGNOSIS — Z1389 Encounter for screening for other disorder: Secondary | ICD-10-CM | POA: Diagnosis not present

## 2019-09-02 DIAGNOSIS — L821 Other seborrheic keratosis: Secondary | ICD-10-CM | POA: Diagnosis not present

## 2019-09-02 DIAGNOSIS — L738 Other specified follicular disorders: Secondary | ICD-10-CM | POA: Diagnosis not present

## 2019-09-02 DIAGNOSIS — R319 Hematuria, unspecified: Secondary | ICD-10-CM | POA: Diagnosis not present

## 2019-09-02 DIAGNOSIS — D2261 Melanocytic nevi of right upper limb, including shoulder: Secondary | ICD-10-CM | POA: Diagnosis not present

## 2019-09-07 DIAGNOSIS — Z23 Encounter for immunization: Secondary | ICD-10-CM | POA: Diagnosis not present

## 2019-09-18 ENCOUNTER — Other Ambulatory Visit: Payer: Medicare Other

## 2019-11-06 ENCOUNTER — Other Ambulatory Visit: Payer: Self-pay | Admitting: Cardiovascular Disease

## 2020-01-28 DIAGNOSIS — H524 Presbyopia: Secondary | ICD-10-CM | POA: Diagnosis not present

## 2020-02-04 ENCOUNTER — Other Ambulatory Visit: Payer: Self-pay | Admitting: Cardiovascular Disease

## 2020-02-14 ENCOUNTER — Encounter: Payer: Self-pay | Admitting: Cardiovascular Disease

## 2020-02-14 ENCOUNTER — Ambulatory Visit: Payer: Medicare Other | Admitting: Cardiovascular Disease

## 2020-02-14 ENCOUNTER — Other Ambulatory Visit: Payer: Self-pay

## 2020-02-14 VITALS — BP 120/84 | HR 65 | Ht 63.5 in | Wt 182.0 lb

## 2020-02-14 DIAGNOSIS — I10 Essential (primary) hypertension: Secondary | ICD-10-CM

## 2020-02-14 DIAGNOSIS — E782 Mixed hyperlipidemia: Secondary | ICD-10-CM | POA: Diagnosis not present

## 2020-02-14 DIAGNOSIS — I2510 Atherosclerotic heart disease of native coronary artery without angina pectoris: Secondary | ICD-10-CM | POA: Diagnosis not present

## 2020-02-14 NOTE — Patient Instructions (Signed)
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
## 2020-03-04 ENCOUNTER — Other Ambulatory Visit: Payer: Self-pay | Admitting: Cardiovascular Disease

## 2020-05-05 ENCOUNTER — Other Ambulatory Visit: Payer: Self-pay | Admitting: Cardiovascular Disease

## 2020-06-06 ENCOUNTER — Other Ambulatory Visit: Payer: Self-pay

## 2020-06-06 ENCOUNTER — Encounter: Payer: Self-pay | Admitting: Intensive Care

## 2020-06-06 ENCOUNTER — Emergency Department
Admission: EM | Admit: 2020-06-06 | Discharge: 2020-06-06 | Disposition: A | Discharge status: Home / Self Care | Payer: Medicare Other | Attending: Emergency Medicine | Admitting: Emergency Medicine

## 2020-06-06 ENCOUNTER — Emergency Department: Payer: Medicare Other

## 2020-06-06 DIAGNOSIS — I2510 Atherosclerotic heart disease of native coronary artery without angina pectoris: Secondary | ICD-10-CM | POA: Insufficient documentation

## 2020-06-06 DIAGNOSIS — I700 Atherosclerosis of aorta: Secondary | ICD-10-CM | POA: Diagnosis not present

## 2020-06-06 DIAGNOSIS — R109 Unspecified abdominal pain: Secondary | ICD-10-CM | POA: Diagnosis present

## 2020-06-06 DIAGNOSIS — N200 Calculus of kidney: Secondary | ICD-10-CM | POA: Insufficient documentation

## 2020-06-06 DIAGNOSIS — Z7982 Long term (current) use of aspirin: Secondary | ICD-10-CM | POA: Insufficient documentation

## 2020-06-06 DIAGNOSIS — I10 Essential (primary) hypertension: Secondary | ICD-10-CM | POA: Diagnosis not present

## 2020-06-06 DIAGNOSIS — M4319 Spondylolisthesis, multiple sites in spine: Secondary | ICD-10-CM | POA: Diagnosis not present

## 2020-06-06 DIAGNOSIS — K5730 Diverticulosis of large intestine without perforation or abscess without bleeding: Secondary | ICD-10-CM | POA: Diagnosis not present

## 2020-06-06 DIAGNOSIS — N132 Hydronephrosis with renal and ureteral calculous obstruction: Secondary | ICD-10-CM | POA: Diagnosis not present

## 2020-06-06 LAB — COMPREHENSIVE METABOLIC PANEL
ALT: 24 U/L (ref 0–44)
AST: 25 U/L (ref 15–41)
Albumin: 4.1 g/dL (ref 3.5–5.0)
Alkaline Phosphatase: 66 U/L (ref 38–126)
Anion gap: 13 (ref 5–15)
BUN: 25 mg/dL — ABNORMAL HIGH (ref 8–23)
CO2: 24 mmol/L (ref 22–32)
Calcium: 9.4 mg/dL (ref 8.9–10.3)
Chloride: 106 mmol/L (ref 98–111)
Creatinine, Ser: 1.34 mg/dL — ABNORMAL HIGH (ref 0.61–1.24)
GFR calc Af Amer: >60 mL/min (ref >60)
GFR calc non Af Amer: 52 mL/min — ABNORMAL LOW (ref >60)
Glucose, Bld: 118 mg/dL — ABNORMAL HIGH (ref 70–99)
Potassium: 4 mmol/L (ref 3.5–5.1)
Sodium: 143 mmol/L (ref 135–145)
Total Bilirubin: 1.2 mg/dL (ref 0.3–1.2)
Total Protein: 7.6 g/dL (ref 6.5–8.1)

## 2020-06-06 LAB — URINALYSIS, COMPLETE (UACMP) WITH MICROSCOPIC
Bacteria, UA: NONE SEEN
Bilirubin Urine: NEGATIVE
Glucose, UA: NEGATIVE mg/dL
Ketones, ur: 5 mg/dL — AB
Leukocytes,Ua: NEGATIVE
Nitrite: NEGATIVE
Protein, ur: NEGATIVE mg/dL
RBC / HPF: >50 RBC/hpf — ABNORMAL HIGH (ref 0–5)
Specific Gravity, Urine: 1.02 (ref 1.005–1.030)
Squamous Epithelial / LPF: NONE SEEN (ref 0–5)
pH: 5 (ref 5.0–8.0)

## 2020-06-06 LAB — LIPASE, BLOOD: Lipase: 41 U/L (ref 11–51)

## 2020-06-06 LAB — CBC
HCT: 45.9 % (ref 39.0–52.0)
Hemoglobin: 16.4 g/dL (ref 13.0–17.0)
MCH: 31.9 pg (ref 26.0–34.0)
MCHC: 35.7 g/dL (ref 30.0–36.0)
MCV: 89.3 fL (ref 80.0–100.0)
Platelets: 203 10*3/uL (ref 150–400)
RBC: 5.14 MIL/uL (ref 4.22–5.81)
RDW: 12.8 % (ref 11.5–15.5)
WBC: 10.1 10*3/uL (ref 4.0–10.5)
nRBC: 0 % (ref 0.0–0.2)

## 2020-06-06 MED ORDER — SODIUM CHLORIDE 0.9 % IV BOLUS
1000.0000 mL | Freq: Once | INTRAVENOUS | Status: AC
  Administered 2020-06-06: 1000 mL via INTRAVENOUS

## 2020-06-06 MED ORDER — HYDROCODONE-ACETAMINOPHEN 5-325 MG PO TABS: 1.0000 | ORAL_TABLET | Freq: Four times a day (QID) | ORAL | 0 refills | Status: AC | PRN

## 2020-06-06 MED ORDER — ONDANSETRON HCL 4 MG/2ML IJ SOLN
4.0000 mg | Freq: Once | INTRAMUSCULAR | Status: AC
  Administered 2020-06-06: 4 mg via INTRAVENOUS
  Filled 2020-06-06: qty 2

## 2020-06-06 MED ORDER — ONDANSETRON 4 MG PO TBDP
4.0000 mg | ORAL_TABLET | Freq: Once | ORAL | Status: AC | PRN
  Administered 2020-06-06: 4 mg via ORAL
  Filled 2020-06-06: qty 1

## 2020-06-06 MED ORDER — MORPHINE SULFATE (PF) 4 MG/ML IV SOLN
4.0000 mg | Freq: Once | INTRAVENOUS | Status: AC
  Administered 2020-06-06: 4 mg via INTRAVENOUS
  Filled 2020-06-06: qty 1

## 2020-06-06 MED ORDER — ONDANSETRON HCL 4 MG PO TABS: 4.0000 mg | ORAL_TABLET | Freq: Three times a day (TID) | ORAL | 0 refills | Status: DC | PRN

## 2020-06-06 NOTE — ED Triage Notes (Signed)
Patient c/o right side flank pain with urinary symptoms.
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
## 2020-06-06 NOTE — Discharge Instructions (Addendum)
Please seek medical attention for any high fevers, chest pain, shortness of breath, change in behavior, persistent vomiting, bloody stool or any other new or concerning symptoms.  

## 2020-07-16 IMAGING — CT CT RENAL STONE PROTOCOL
2 of 4 series · 16 of 46 positions shown, 18 images · non-contrast
Comparison: None.

CLINICAL DATA: Right-sided flank pain, urinary symptoms

EXAM:
CT ABDOMEN AND PELVIS WITHOUT CONTRAST
TECHNIQUE: Multidetector CT imaging of the abdomen and pelvis was performed
following the standard protocol without IV contrast.

[Series 2: stone full standard · axial · 0.79mm/px · z∈[-998,-588]mm · 13 of 92 slices shown, 15 images]
[im 5/92  soft-tissue]
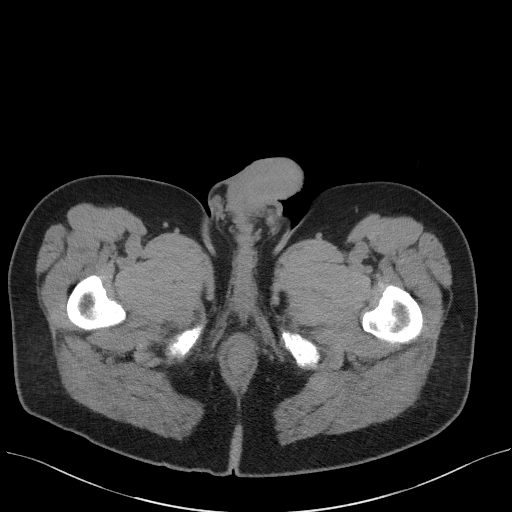
[im 5/92  bone]
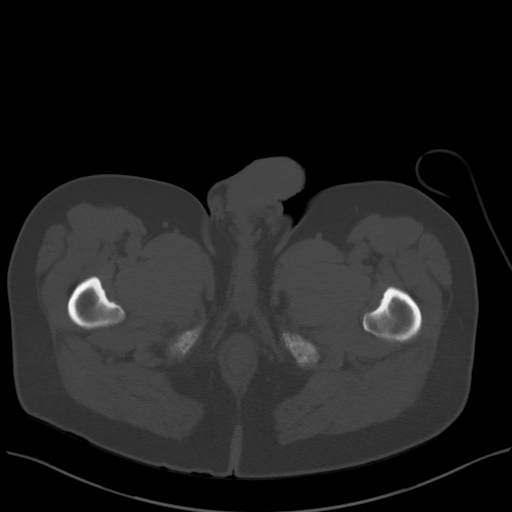
[im 13/92  soft-tissue]
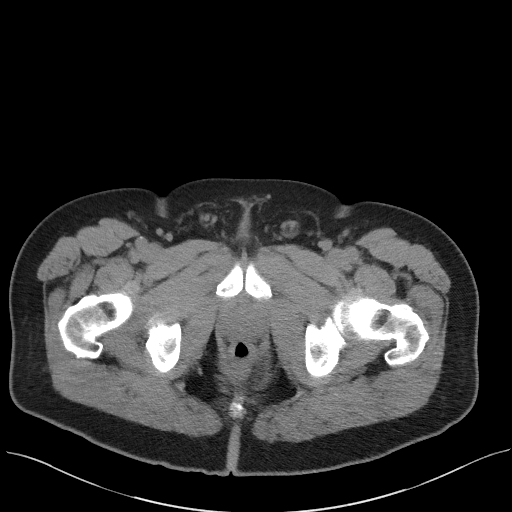
[im 21/92  soft-tissue]
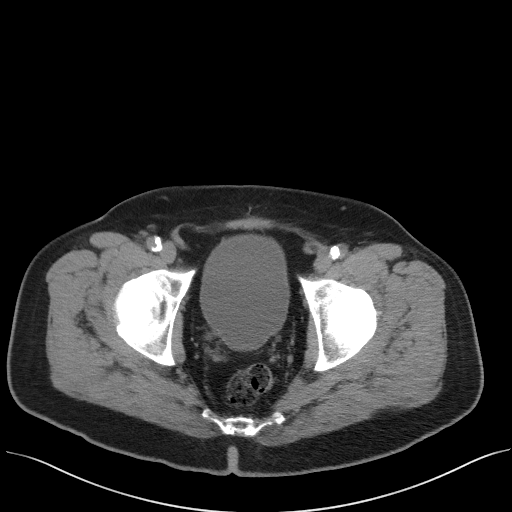
[im 25/92  soft-tissue]
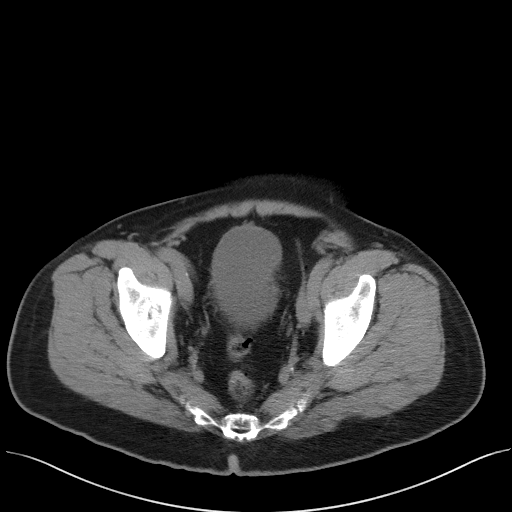
[im 34/92  soft-tissue]
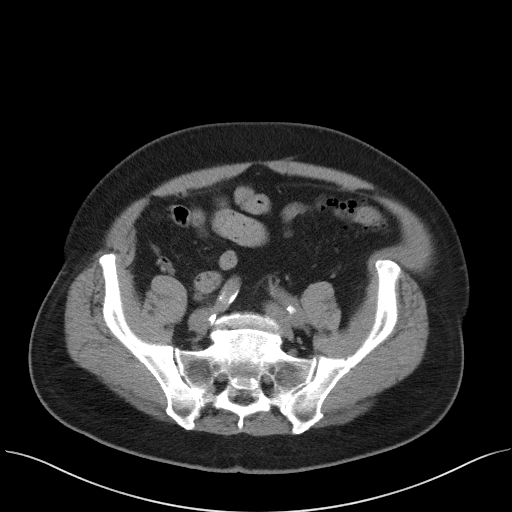
[im 38/92  soft-tissue]
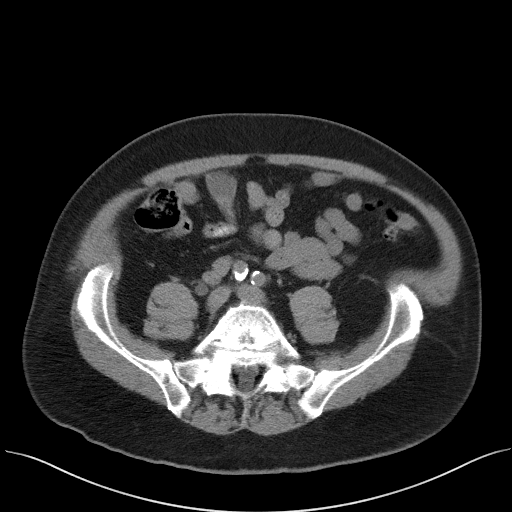
[im 46/92  soft-tissue]
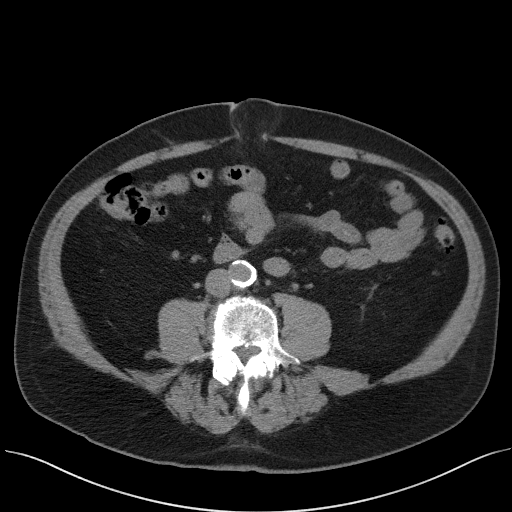
[im 54/92  soft-tissue]
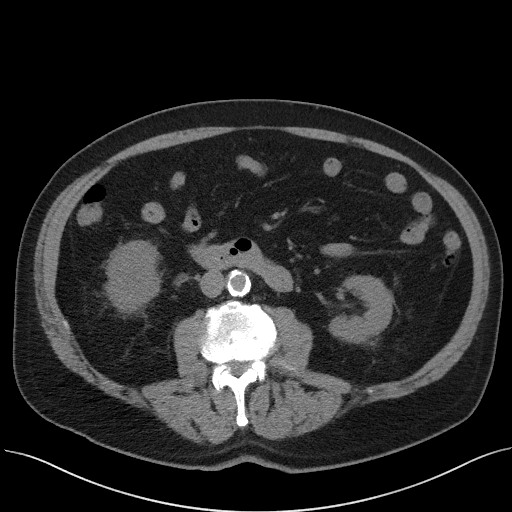
[im 58/92  soft-tissue]
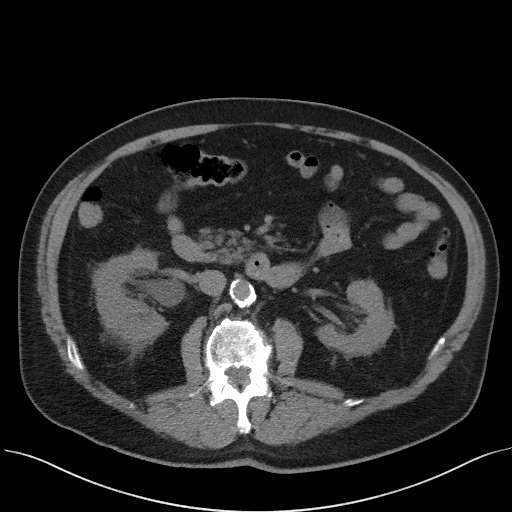
[im 58/92  bone]
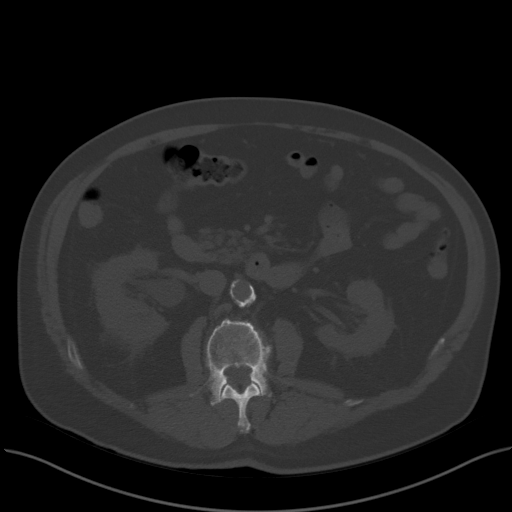
[im 67/92  soft-tissue]
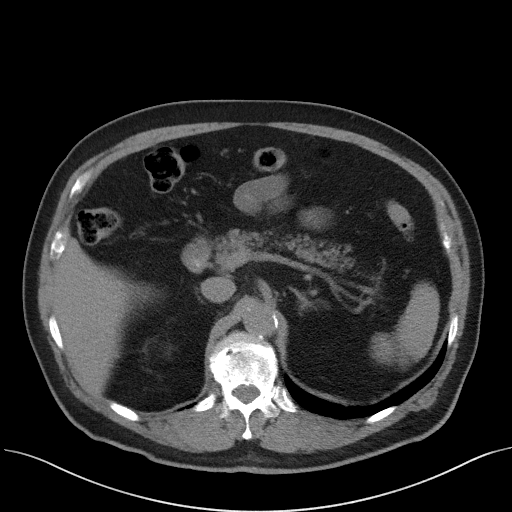
[im 71/92  soft-tissue]
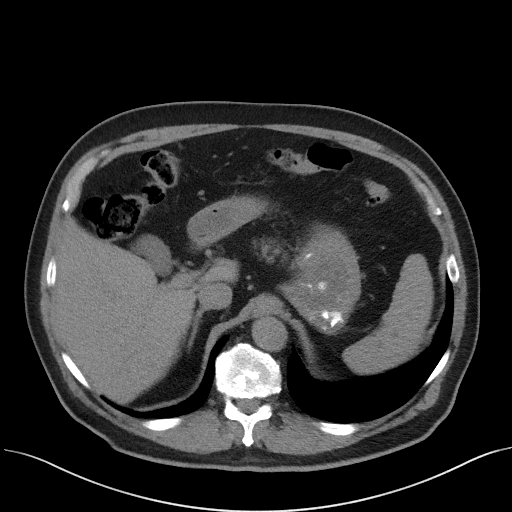
[im 79/92  soft-tissue]
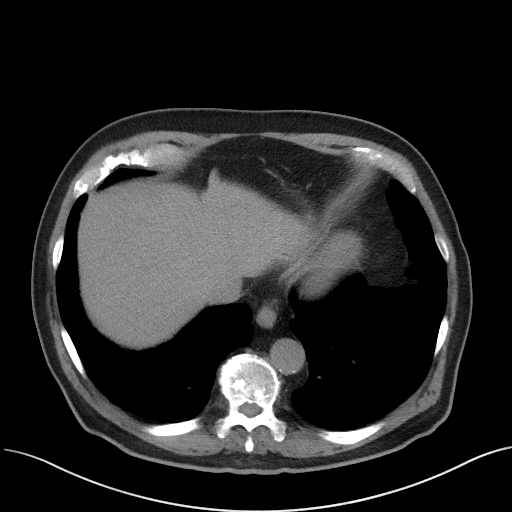
[im 87/92  soft-tissue]
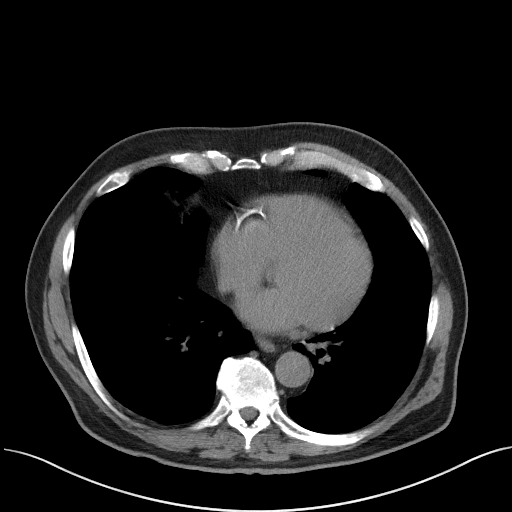

[Series 5: coronal · coronal · 0.77mm/px · 3 of 144 slices shown]
[im 48/144  soft-tissue]
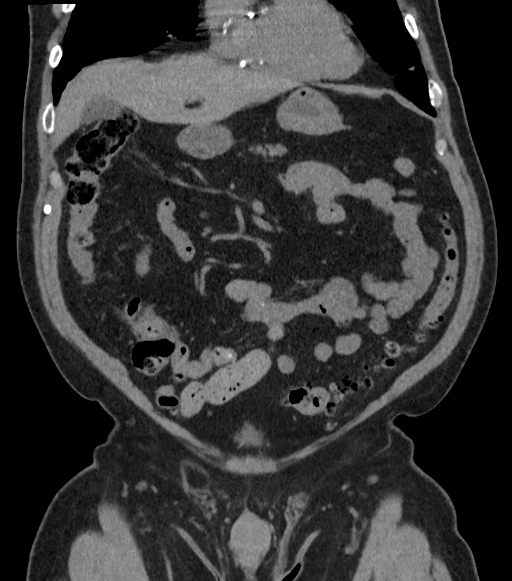
[im 64/144  soft-tissue]
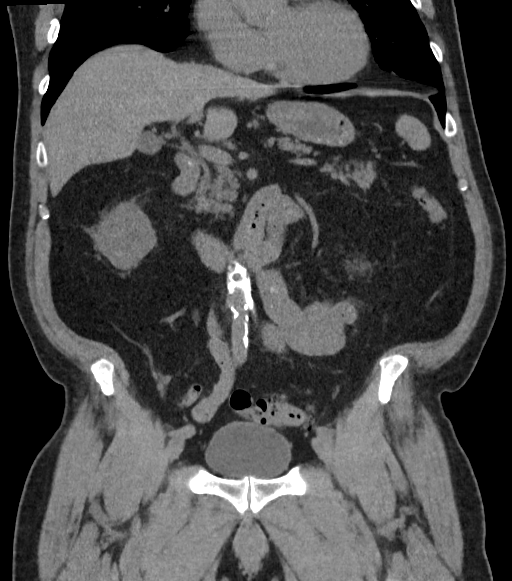
[im 80/144  soft-tissue]
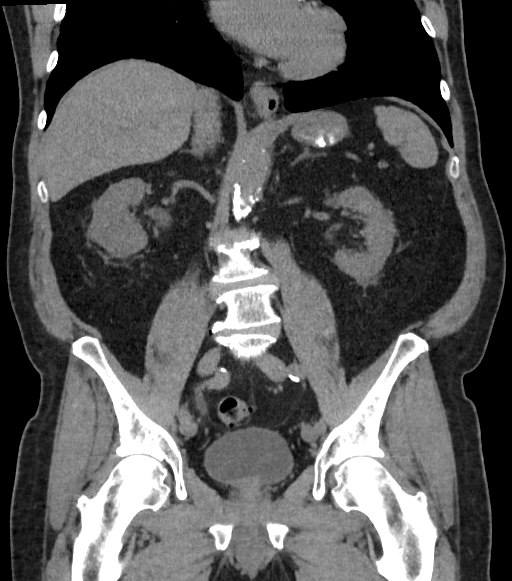

[16 of 46 positions shown; findings below may reference images not displayed]

FINDINGS: Lower chest: No acute abnormality. Scarring of the bilateral lung
bases. Coronary artery calcifications and stents.

Hepatobiliary: No solid liver abnormality is seen. No gallstones,
gallbladder wall thickening, or biliary dilatation.

Pancreas: Unremarkable. No pancreatic ductal dilatation or
surrounding inflammatory changes.

Spleen: Normal in size without significant abnormality.

Adrenals/Urinary Tract: Adrenal glands are unremarkable. Punctuate
calculus at the right ureterovesicular junction with mild associated
right hydronephrosis and hydroureter. Bladder is unremarkable.

Stomach/Bowel: Stomach is within normal limits. Appendix appears
normal. No evidence of bowel wall thickening, distention, or
inflammatory changes. Descending and sigmoid diverticulosis.

Vascular/Lymphatic: Aortic atherosclerosis. No enlarged abdominal or
pelvic lymph nodes.

Reproductive: No mass or other significant abnormality.

Other: Fat containing umbilical hernia.  No abdominopelvic ascites.

Musculoskeletal: No acute or significant osseous findings. Bilateral
pars defects of L5 with degenerative anterolisthesis of L5 on S1.
IMPRESSION: 1. Punctuate calculus at the right ureterovesicular junction with
mild associated right hydronephrosis and hydroureter. No other
evidence of urinary tract calculus.
2. Descending and sigmoid diverticulosis without evidence of acute
diverticulitis.
3. Coronary artery disease.  Aortic Atherosclerosis (UABJT-N13.3).

## 2020-09-09 DIAGNOSIS — I10 Essential (primary) hypertension: Secondary | ICD-10-CM | POA: Diagnosis not present

## 2020-09-09 DIAGNOSIS — Z0000 Encounter for general adult medical examination without abnormal findings: Secondary | ICD-10-CM | POA: Diagnosis not present

## 2020-09-09 DIAGNOSIS — E7800 Pure hypercholesterolemia, unspecified: Secondary | ICD-10-CM | POA: Diagnosis not present

## 2020-09-09 DIAGNOSIS — I25119 Atherosclerotic heart disease of native coronary artery with unspecified angina pectoris: Secondary | ICD-10-CM | POA: Diagnosis not present

## 2020-10-10 DIAGNOSIS — Z23 Encounter for immunization: Secondary | ICD-10-CM | POA: Diagnosis not present

## 2021-02-03 ENCOUNTER — Other Ambulatory Visit: Payer: Self-pay | Admitting: Cardiovascular Disease

## 2021-02-16 NOTE — Progress Notes (Addendum)
Cardiology Office Note:    Date:  02/17/2021   ID:  Blair Heys, DOB 08-30-1946, MRN [id number]  PCP:  Renford Dills, MD   [facility]  Cardiologist:  Tonny Bollman, MD   Electrophysiologist:  None       Referring MD: Renford Dills, MD   Chief Complaint:  Follow-up (CAD)    Patient Profile:    Jason Bell is a 75 y.o. male with:   Coronary artery disease   S/p DES to LAD and LCx in 2008  RCA chronically occluded   Hypertension   Hyperlipidemia   Prior CV studies: Cardiac catheterization 20-May-2007  LM normal  LAD prox 70-75, D1 50 RI prox 40 LCx mid 95 RCA mid 100, L-R collaterals  EF 50 PCI:  2.5 x 12 mm Endeavor DES to LCx; 3 x 16 mm Endeavor DES to LAD   History of Present Illness:    Mr. Buys was last seen by Dr. Excell Seltzer in 3/21. He returns for f/u.  He is here alone.  He is overall doing well.  He denies chest pain, shortness of breath, syncope, orthopnea, or significant pedal edema.       Past Medical History:  Diagnosis Date  . Congenital nystagmus   . Coronary artery disease    s/p multivessel PCI 2008 2 stents, Dr. Tonny Bollman  . Hyperlipidemia   . Hypertension     Current Medications: Current Meds  Medication Sig  . aspirin 81 MG tablet Take 162 mg by mouth every morning.  . empagliflozin (JARDIANCE) 10 MG TABS tablet   . HYDROcodone-acetaminophen (NORCO/VICODIN) 5-325 MG tablet Take 1 tablet by mouth every 6 (six) hours as needed for severe pain.  Marland Kitchen ibuprofen (ADVIL,MOTRIN) 200 MG tablet Take 200 mg by mouth every 6 (six) hours as needed for moderate pain.  . Multiple Vitamin (MULTIVITAMIN PO) Take 1 tablet by mouth in the morning.  . Omega-3 Fatty Acids (FISH OIL PO) Take 1,000-2,000 mg by mouth 2 (two) times daily. Takes one tablet in the morning  (1,000) and two tablet at night.( 2,000 mg)  . ondansetron (ZOFRAN) 4 MG tablet Take 1 tablet (4 mg total) by mouth every 8 (eight) hours as needed.  .  [DISCONTINUED] ezetimibe (ZETIA) 10 MG tablet TAKE ONE TABLET BY MOUTH EVERY DAY  . [DISCONTINUED] metoprolol succinate (TOPROL-XL) 50 MG 24 hr tablet TAKE ONE TABLET EVERY DAY WITH OR IMMEDIATELY FOLLOWING A MEAL  . [DISCONTINUED] rosuvastatin (CRESTOR) 20 MG tablet TAKE ONE TABLET EVERY DAY     Allergies:   Patient has no known allergies.   Social History   Tobacco Use  . Smoking status: Never Smoker  . Smokeless tobacco: Never Used  Vaping Use  . Vaping Use: Never used  Substance Use Topics  . Alcohol use: Yes    Comment: social  . Drug use: No     Family Hx: The patient's family history includes Heart disease in his mother.  Review of Systems  Cardiovascular: Negative for claudication.     EKGs/Labs/Other Test Reviewed:    EKG:  EKG is   ordered today.  The ekg ordered today demonstrates normal sinus rhythm, heart rate 64, normal axis, no ST-T wave changes, QTC 420, similar to prior tracings  Recent Labs: 06/06/2020: ALT 24; BUN 25; Creatinine, Ser 1.34; Hemoglobin 16.4; Platelets 203; Potassium 4.0; Sodium 143   Recent Lipid Panel Lab Results  Component Value Date/Time   CHOL 159 06/13/2019 10:24 AM  TRIG 119 06/13/2019 10:24 AM   HDL 46 06/13/2019 10:24 AM   CHOLHDL 3.5 06/13/2019 10:24 AM   CHOLHDL 3 01/21/2010 08:29 AM   LDLCALC 89 06/13/2019 10:24 AM      Risk Assessment/Calculations:      Physical Exam:    VS:  BP 118/72   Pulse 64   Ht 5\' 3"  (1.6 m)   Wt 169 lb 9.6 oz (76.9 kg)   SpO2 96%   BMI 30.04 kg/m     Wt Readings from Last 3 Encounters:  02/17/21 169 lb 9.6 oz (76.9 kg)  06/06/20 180 lb (81.6 kg)  02/14/20 182 lb (82.6 kg)     Constitutional:      Appearance: Healthy appearance. Not in distress.  Neck:     Vascular: No carotid bruit. JVD normal.  Pulmonary:     Effort: Pulmonary effort is normal.     Breath sounds: No wheezing. No rales.  Cardiovascular:     Normal rate. Regular rhythm. Normal S1. Normal S2.     Murmurs:  There is no murmur.  Edema:    Peripheral edema absent.  Abdominal:     Palpations: Abdomen is soft. There is no hepatomegaly.  Skin:    General: Skin is warm and dry.  Neurological:     General: No focal deficit present.     Mental Status: Alert and oriented to person, place and time.     Cranial Nerves: Cranial nerves are intact.       ASSESSMENT & PLAN:    1. Coronary artery disease involving native coronary artery of native heart without angina pectoris History of DES to the LAD and LCx in 2008.  His RCA is known to be chronically occluded.  He is doing well without anginal symptoms.  Continue current medications which include aspirin 81 mg daily, ezetimibe 10 mg daily, metoprolol succinate 50 mg daily and rosuvastatin 20 mg daily.  2. Essential hypertension Blood pressure is well controlled.  Continue current management.  3. Mixed hyperlipidemia LDL optimal on most recent lab work.  Continue current Rx.     4. Diabetes mellitus  Recent hemoglobin A1c 6.5.  He is currently on empagliflozin.  We briefly discussed CV benefits related to SGLT2 inhibitors.    Dispo:  Return for Routine Follow Up, w/ Dr. 2009, or Excell Seltzer, PA-C, in person.   Medication Adjustments/Labs and Tests Ordered: Current medicines are reviewed at length with the patient today.  Concerns regarding medicines are outlined above.  Tests Ordered: Orders Placed This Encounter  Procedures  . EKG 12-Lead   Medication Changes: Meds ordered this encounter  Medications  . rosuvastatin (CRESTOR) 20 MG tablet    Sig: Take 1 tablet (20 mg total) by mouth daily.    Dispense:  90 tablet    Refill:  3    FOR NEXT FILL  . metoprolol succinate (TOPROL-XL) 50 MG 24 hr tablet    Sig: Take with or immediately following a meal.    Dispense:  90 tablet    Refill:  3    FOR NEXT FILL  . ezetimibe (ZETIA) 10 MG tablet    Sig: Take 1 tablet (10 mg total) by mouth daily.    Dispense:  90 tablet    Refill:  3     FOR NEXT FILL    Signed, Tereso Newcomer, PA-C  02/17/2021 9:41 AM    [facility] [address] [place], [place], [place]  [place] Phone: [phone]; Fax: [phone]

## 2021-02-17 ENCOUNTER — Ambulatory Visit: Payer: Medicare Other | Admitting: Physician Assistant

## 2021-02-17 ENCOUNTER — Other Ambulatory Visit: Payer: Self-pay

## 2021-02-17 ENCOUNTER — Encounter: Payer: Self-pay | Admitting: Physician Assistant

## 2021-02-17 VITALS — BP 118/72 | HR 64 | Ht 63.0 in | Wt 169.6 lb

## 2021-02-17 DIAGNOSIS — I2510 Atherosclerotic heart disease of native coronary artery without angina pectoris: Secondary | ICD-10-CM

## 2021-02-17 DIAGNOSIS — I10 Essential (primary) hypertension: Secondary | ICD-10-CM

## 2021-02-17 DIAGNOSIS — E118 Type 2 diabetes mellitus with unspecified complications: Secondary | ICD-10-CM

## 2021-02-17 DIAGNOSIS — E782 Mixed hyperlipidemia: Secondary | ICD-10-CM

## 2021-02-17 MED ORDER — ROSUVASTATIN CALCIUM 20 MG PO TABS: 20.0000 mg | ORAL_TABLET | Freq: Every day | ORAL | 3 refills | Status: DC

## 2021-02-17 MED ORDER — EZETIMIBE 10 MG PO TABS: 10.0000 mg | ORAL_TABLET | Freq: Every day | ORAL | 3 refills | Status: DC

## 2021-02-17 MED ORDER — METOPROLOL SUCCINATE ER 50 MG PO TB24: ORAL_TABLET | ORAL | 3 refills | Status: DC

## 2021-02-17 NOTE — Patient Instructions (Addendum)
Medication Instructions:  Continue your current medications.  *If you need a refill on your cardiac medications before your next appointment, please call your pharmacy*   Follow-Up: At [facility], you and your health needs are our priority.  As part of our continuing mission to provide you with exceptional heart care, we have created designated Provider Care Teams.  These Care Teams include your primary Cardiologist (physician) and Advanced Practice Providers (APPs -  Physician Assistants and Nurse Practitioners) who all work together to provide you with the care you need, when you need it.  We recommend signing up for the patient portal called "MyChart".  Sign up information is provided on this After Visit Summary.  MyChart is used to connect with patients for Virtual Visits (Telemedicine).  Patients are able to view lab/test results, encounter notes, upcoming appointments, etc.  Non-urgent messages can be sent to your provider as well.   To learn more about what you can do with MyChart, go to [email or web address].    Your next appointment:   12 month(s)  The format for your next appointment:   In Person  Provider:   Tonny Bollman, MD or Tereso Newcomer, PA-C   Other Instructions   All cardiac medications were filled today  # 90 to requested pharmacy.

## 2021-08-02 ENCOUNTER — Other Ambulatory Visit: Payer: Self-pay

## 2021-08-02 ENCOUNTER — Ambulatory Visit
Admission: RE | Admit: 2021-08-02 | Discharge: 2021-08-02 | Disposition: A | Discharge status: Home / Self Care | Payer: Medicare Other | Source: Ambulatory Visit | Attending: Internal Medicine | Admitting: Internal Medicine

## 2021-08-02 ENCOUNTER — Other Ambulatory Visit: Payer: Self-pay | Admitting: Internal Medicine

## 2021-08-02 DIAGNOSIS — M542 Cervicalgia: Secondary | ICD-10-CM

## 2021-09-18 DIAGNOSIS — Z23 Encounter for immunization: Secondary | ICD-10-CM | POA: Diagnosis not present

## 2021-09-23 DIAGNOSIS — E1169 Type 2 diabetes mellitus with other specified complication: Secondary | ICD-10-CM | POA: Diagnosis not present

## 2021-09-23 DIAGNOSIS — H5500 Unspecified nystagmus: Secondary | ICD-10-CM | POA: Diagnosis not present

## 2021-09-23 DIAGNOSIS — I25119 Atherosclerotic heart disease of native coronary artery with unspecified angina pectoris: Secondary | ICD-10-CM | POA: Diagnosis not present

## 2021-09-23 DIAGNOSIS — Z0000 Encounter for general adult medical examination without abnormal findings: Secondary | ICD-10-CM | POA: Diagnosis not present

## 2021-09-23 DIAGNOSIS — E7800 Pure hypercholesterolemia, unspecified: Secondary | ICD-10-CM | POA: Diagnosis not present

## 2021-09-23 DIAGNOSIS — I10 Essential (primary) hypertension: Secondary | ICD-10-CM | POA: Diagnosis not present

## 2022-02-24 ENCOUNTER — Other Ambulatory Visit: Payer: Self-pay | Admitting: Physician Assistant

## 2022-03-30 ENCOUNTER — Other Ambulatory Visit: Payer: Self-pay | Admitting: Cardiovascular Disease

## 2022-04-21 NOTE — Progress Notes (Signed)
Cardiology Office Note:    Date:  04/22/2022   ID:  Jason Bell, DOB [birth year], MRN [id number]  PCP:  Renford Dills, MD  [facility] Providers Cardiologist:  Tonny Bollman, MD    Referring MD: Renford Dills, MD   Chief Complaint:  Follow-up for CAD    Patient Profile: Coronary artery disease  S/p DES to LAD and LCx in 2008 RCA chronically occluded  Hypertension  Hyperlipidemia   Prior CV Studies: Cardiac catheterization 2007-06-06  LM normal  LAD prox 70-75, D1 50 RI prox 40 LCx mid 95 RCA mid 100, L-R collaterals  EF 50 PCI:  2.5 x 12 mm Endeavor DES to LCx; 3 x 16 mm Endeavor DES to LAD   History of Present Illness:   Jason Bell is a 76 y.o. male with the above problem list.  He was last seen in March 2022.  He returns for f/u of his coronary artery disease.  He is here alone.  He has been doing well without exertional chest pain, significant shortness of breath, syncope, leg edema.  He worked clearing limbs from a fallen tree yesterday.  He did feel somewhat lightheaded with this.  However, this was more exertion than he has done a long time.  He typically can exert himself without issues.  He has occasional positional chest pain.        Past Medical History:  Diagnosis Date   Congenital nystagmus    Coronary artery disease    s/p multivessel PCI 2008 2 stents, Dr. Tonny Bollman   Hyperlipidemia    Hypertension    Current Medications: Current Meds  Medication Sig   aspirin 81 MG tablet Take 162 mg by mouth every morning.   empagliflozin (JARDIANCE) 10 MG TABS tablet 10 mg daily.   ibuprofen (ADVIL,MOTRIN) 200 MG tablet Take 200 mg by mouth every 6 (six) hours as needed for moderate pain.   Multiple Vitamin (MULTIVITAMIN PO) Take 1 tablet by mouth in the morning.   Omega-3 Fatty Acids (FISH OIL PO) Take 1,000-2,000 mg by mouth 2 (two) times daily. Takes one tablet in the morning  (1,000) and two tablet at night.( 2,000 mg)   [DISCONTINUED] ezetimibe  (ZETIA) 10 MG tablet Take 1 tablet (10 mg total) by mouth daily.   [DISCONTINUED] metoprolol succinate (TOPROL-XL) 50 MG 24 hr tablet Take 1 tablet (50 mg total) by mouth daily. TAKE WITH OR IMMEDIATELY FOLLOWING A MEAL   [DISCONTINUED] rosuvastatin (CRESTOR) 20 MG tablet Take 1 tablet (20 mg total) by mouth daily.    Allergies:   Patient has no known allergies.   Social History   Tobacco Use   Smoking status: Never   Smokeless tobacco: Never  Vaping Use   Vaping Use: Never used  Substance Use Topics   Alcohol use: Yes    Comment: social   Drug use: No    Family Hx: The patient's family history includes Heart disease in his mother.  Review of Systems  Cardiovascular:  Negative for claudication.  Gastrointestinal:  Negative for hematochezia.  Genitourinary:  Negative for hematuria.    EKGs/Labs/Other Test Reviewed:    EKG:  EKG is  ordered today.  The ekg ordered today demonstrates NSR, HR 61, normal axis, no ST-T wave changes, QTc 408, similar to prior tracing  Recent Labs: No results found for requested labs within last 8760 hours.   Recent Lipid Panel No results for input(s): CHOL, TRIG, HDL, VLDL, LDLCALC, LDLDIRECT in the last 8760 hours.  Labs obtained through [facility] - personally reviewed and interpreted: 09/23/2021: Total cholesterol 133, HDL 42, LDL 70, triglycerides 111, A1c 6.4, hemoglobin 16.7, creatinine 1.14, K+ 4.2, ALT 19, TSH 2.67  Risk Assessment/Calculations:         Physical Exam:    VS:  BP 122/78   Pulse 61   Ht 5\' 3"  (1.6 m)   Wt 155 lb 6.4 oz (70.5 kg)   SpO2 96%   BMI 27.53 kg/m     Wt Readings from Last 3 Encounters:  04/22/22 155 lb 6.4 oz (70.5 kg)  02/17/21 169 lb 9.6 oz (76.9 kg)  06/06/20 180 lb (81.6 kg)    Constitutional:      Appearance: Healthy appearance. Not in distress.  Neck:     Vascular: No carotid bruit or JVR. JVD normal.  Pulmonary:     Effort: Pulmonary effort is normal.     Breath sounds: No wheezing. No  rales.  Cardiovascular:     Normal rate. Regular rhythm. Normal S1. Normal S2.      Murmurs: There is no murmur.  Edema:    Peripheral edema absent.  Abdominal:     Palpations: Abdomen is soft.  Skin:    General: Skin is warm and dry.  Neurological:     General: No focal deficit present.     Mental Status: Alert and oriented to person, place and time.     Cranial Nerves: Cranial nerves are intact.        ASSESSMENT & PLAN:   CAD (coronary artery disease) History of DES to the LAD in 2008.  He has a chronically occluded RCA.  Overall, he is doing well without chest pain to suggest angina.  Continue ezetimibe 10 mg daily, metoprolol succinate 50 mg daily, rosuvastatin 20 mg daily.  Follow-up in 1 year.  Essential hypertension The patient's blood pressure is controlled on his current regimen.  Continue current therapy.   Hyperlipidemia LDL goal <70 LDL close to goal on current therapy.  Continue ezetimibe 10 mg daily, rosuvastatin 20 mg.            Dispo:  Return in about 1 year (around 04/23/2023) for Routine Follow Up, w/ Dr. 04/25/2023, or Excell Seltzer, PA-C.   Medication Adjustments/Labs and Tests Ordered: Current medicines are reviewed at length with the patient today.  Concerns regarding medicines are outlined above.  Tests Ordered: Orders Placed This Encounter  Procedures   EKG 12-Lead   Medication Changes: Meds ordered this encounter  Medications   ezetimibe (ZETIA) 10 MG tablet    Sig: Take 1 tablet (10 mg total) by mouth daily.    Dispense:  90 tablet    Refill:  3   metoprolol succinate (TOPROL-XL) 50 MG 24 hr tablet    Sig: Take 1 tablet (50 mg total) by mouth daily. TAKE WITH OR IMMEDIATELY FOLLOWING A MEAL    Dispense:  90 tablet    Refill:  3   rosuvastatin (CRESTOR) 20 MG tablet    Sig: Take 1 tablet (20 mg total) by mouth daily.    Dispense:  90 tablet    Refill:  3   Signed, Tereso Newcomer, PA-C  04/22/2022 9:50 AM    [facility]  [facility] [address] [place], [place], [place]  [place] Phone: [phone]; Fax: [phone]

## 2022-04-22 ENCOUNTER — Encounter: Payer: Self-pay | Admitting: Physician Assistant

## 2022-04-22 ENCOUNTER — Ambulatory Visit: Payer: Medicare Other | Admitting: Physician Assistant

## 2022-04-22 VITALS — BP 122/78 | HR 61 | Ht 63.0 in | Wt 155.4 lb

## 2022-04-22 DIAGNOSIS — I10 Essential (primary) hypertension: Secondary | ICD-10-CM | POA: Diagnosis not present

## 2022-04-22 DIAGNOSIS — E785 Hyperlipidemia, unspecified: Secondary | ICD-10-CM

## 2022-04-22 DIAGNOSIS — I2510 Atherosclerotic heart disease of native coronary artery without angina pectoris: Secondary | ICD-10-CM

## 2022-04-22 MED ORDER — ROSUVASTATIN CALCIUM 20 MG PO TABS: 20.0000 mg | ORAL_TABLET | Freq: Every day | ORAL | 3 refills | Status: DC

## 2022-04-22 MED ORDER — METOPROLOL SUCCINATE ER 50 MG PO TB24: 50.0000 mg | ORAL_TABLET | Freq: Every day | ORAL | 3 refills | Status: DC

## 2022-04-22 MED ORDER — EZETIMIBE 10 MG PO TABS: 10.0000 mg | ORAL_TABLET | Freq: Every day | ORAL | 3 refills | Status: DC

## 2022-04-22 NOTE — Patient Instructions (Signed)
Medication Instructions:  Your physician recommends that you continue on your current medications as directed. Please refer to the Current Medication list given to you today.  *If you need a refill on your cardiac medications before your next appointment, please call your pharmacy*   Lab Work: None ordered  If you have labs (blood work) drawn today and your tests are completely normal, you will receive your results only by: MyChart Message (if you have MyChart) OR A paper copy in the mail If you have any lab test that is abnormal or we need to change your treatment, we will call you to review the results.   Testing/Procedures: None ordered   Follow-Up: At [facility], you and your health needs are our priority.  As part of our continuing mission to provide you with exceptional heart care, we have created designated Provider Care Teams.  These Care Teams include your primary Cardiologist (physician) and Advanced Practice Providers (APPs -  Physician Assistants and Nurse Practitioners) who all work together to provide you with the care you need, when you need it.  We recommend signing up for the patient portal called "MyChart".  Sign up information is provided on this After Visit Summary.  MyChart is used to connect with patients for Virtual Visits (Telemedicine).  Patients are able to view lab/test results, encounter notes, upcoming appointments, etc.  Non-urgent messages can be sent to your provider as well.   To learn more about what you can do with MyChart, go to [email or web address].    Your next appointment:   1 year(s)  The format for your next appointment:   In Person  Provider:   Michael Cooper, MD  or Scott Weaver, PA-C         Other Instructions   Important Information About Sugar       

## 2022-04-22 NOTE — Assessment & Plan Note (Signed)
LDL close to goal on current therapy.  Continue ezetimibe 10 mg daily, rosuvastatin 20 mg.

## 2022-04-22 NOTE — Assessment & Plan Note (Signed)
The patient's blood pressure is controlled on his current regimen.  Continue current therapy.  

## 2022-04-22 NOTE — Assessment & Plan Note (Signed)
History of DES to the LAD in 2008.  He has a chronically occluded RCA.  Overall, he is doing well without chest pain to suggest angina.  Continue ezetimibe 10 mg daily, metoprolol succinate 50 mg daily, rosuvastatin 20 mg daily.  Follow-up in 1 year.

## 2022-05-12 DIAGNOSIS — M5412 Radiculopathy, cervical region: Secondary | ICD-10-CM | POA: Diagnosis not present

## 2022-05-12 DIAGNOSIS — M542 Cervicalgia: Secondary | ICD-10-CM | POA: Diagnosis not present

## 2022-05-31 DIAGNOSIS — M542 Cervicalgia: Secondary | ICD-10-CM | POA: Diagnosis not present

## 2022-06-24 DIAGNOSIS — M542 Cervicalgia: Secondary | ICD-10-CM | POA: Diagnosis not present

## 2022-09-03 DIAGNOSIS — Z23 Encounter for immunization: Secondary | ICD-10-CM | POA: Diagnosis not present

## 2022-10-14 DIAGNOSIS — I10 Essential (primary) hypertension: Secondary | ICD-10-CM | POA: Diagnosis not present

## 2022-10-14 DIAGNOSIS — E1169 Type 2 diabetes mellitus with other specified complication: Secondary | ICD-10-CM | POA: Diagnosis not present

## 2022-10-14 DIAGNOSIS — I25119 Atherosclerotic heart disease of native coronary artery with unspecified angina pectoris: Secondary | ICD-10-CM | POA: Diagnosis not present

## 2022-10-14 DIAGNOSIS — Z0000 Encounter for general adult medical examination without abnormal findings: Secondary | ICD-10-CM | POA: Diagnosis not present

## 2022-10-14 DIAGNOSIS — E7800 Pure hypercholesterolemia, unspecified: Secondary | ICD-10-CM | POA: Diagnosis not present

## 2022-10-14 DIAGNOSIS — H5500 Unspecified nystagmus: Secondary | ICD-10-CM | POA: Diagnosis not present

## 2022-12-06 DIAGNOSIS — E039 Hypothyroidism, unspecified: Secondary | ICD-10-CM | POA: Diagnosis not present

## 2023-01-11 DIAGNOSIS — K080 Exfoliation of teeth due to systemic causes: Secondary | ICD-10-CM | POA: Diagnosis not present

## 2023-02-16 DIAGNOSIS — K080 Exfoliation of teeth due to systemic causes: Secondary | ICD-10-CM | POA: Diagnosis not present

## 2023-03-16 DIAGNOSIS — K080 Exfoliation of teeth due to systemic causes: Secondary | ICD-10-CM | POA: Diagnosis not present

## 2023-03-28 ENCOUNTER — Other Ambulatory Visit: Payer: Self-pay | Admitting: Physician Assistant

## 2023-04-14 DIAGNOSIS — H526 Other disorders of refraction: Secondary | ICD-10-CM | POA: Diagnosis not present

## 2023-04-20 DIAGNOSIS — R030 Elevated blood-pressure reading, without diagnosis of hypertension: Secondary | ICD-10-CM | POA: Diagnosis not present

## 2023-04-20 DIAGNOSIS — I10 Essential (primary) hypertension: Secondary | ICD-10-CM | POA: Diagnosis not present

## 2023-04-27 DIAGNOSIS — R0600 Dyspnea, unspecified: Secondary | ICD-10-CM | POA: Diagnosis not present

## 2023-04-27 DIAGNOSIS — I25119 Atherosclerotic heart disease of native coronary artery with unspecified angina pectoris: Secondary | ICD-10-CM | POA: Diagnosis not present
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
## 2023-05-02 ENCOUNTER — Ambulatory Visit: Payer: Medicare Other | Attending: Physician Assistant | Admitting: Nurse Practitioner

## 2023-05-02 ENCOUNTER — Encounter: Payer: Self-pay | Admitting: Nurse Practitioner

## 2023-05-02 VITALS — BP 128/86 | HR 64 | Ht 63.0 in | Wt 159.6 lb

## 2023-05-02 DIAGNOSIS — I10 Essential (primary) hypertension: Secondary | ICD-10-CM

## 2023-05-02 DIAGNOSIS — E785 Hyperlipidemia, unspecified: Secondary | ICD-10-CM

## 2023-05-02 DIAGNOSIS — I2510 Atherosclerotic heart disease of native coronary artery without angina pectoris: Secondary | ICD-10-CM

## 2023-05-02 DIAGNOSIS — R5383 Other fatigue: Secondary | ICD-10-CM

## 2023-05-02 MED ORDER — VALSARTAN 40 MG PO TABS: 40.0000 mg | ORAL_TABLET | Freq: Every day | ORAL | 11 refills | Status: DC

## 2023-05-02 MED ORDER — METOPROLOL SUCCINATE ER 50 MG PO TB24: 50.0000 mg | ORAL_TABLET | Freq: Every day | ORAL | 3 refills | Status: DC

## 2023-05-02 NOTE — Patient Instructions (Signed)
Medication Instructions:   DECREASE Toprol one (1) tablet by mouth ( 50 mg ) daily.   START Valsartan one (1) tablet by mouth ( 40 mg) daily.  *If you need a refill on your cardiac medications before your next appointment, please call your pharmacy*   Lab Work:  Your physician recommends that you return for lab work on Wednesday, June 5. You can come in on the day of your appointment anytime between 7:30-4:30.  If you have labs (blood work) drawn today and your tests are completely normal, you will receive your results only by: MyChart Message (if you have MyChart) OR A paper copy in the mail If you have any lab test that is abnormal or we need to change your treatment, we will call you to review the results.   Testing/Procedures:  How to Prepare for Your Cardiac PET/CT Stress Test:  1. Please do not take these medications before your test:   Medications that may interfere with the cardiac pharmacological stress agent (ex. nitrates - including erectile dysfunction medications, isosorbide mononitrate, tamulosin or beta-blockers) the day of the exam. (Erectile dysfunction medication should be held for at least 72 hrs prior to test) Your remaining medications may be taken with water.  2. Nothing to eat or drink, except water, 3 hours prior to arrival time.   NO caffeine/decaffeinated products, or chocolate 12 hours prior to arrival.  3. NO cologne or lotion  4. Total time is 1 to 2 hours; you may want to bring reading material for the waiting time.  5. Please report to Radiology at the [facility] Main Entrance 30 minutes early for your test.  [address] [place], [place] [address]  IF YOU THINK YOU MAY BE PREGNANT, OR ARE NURSING PLEASE INFORM THE TECHNOLOGIST.  In preparation for your appointment, medication and supplies will be purchased.  Appointment availability is limited, so if you need to cancel or reschedule, please call the Radiology Department at  [phone]  24 hours in advance to avoid a cancellation fee of $100.00  What to Expect After you Arrive:  Once you arrive and check in for your appointment, you will be taken to a preparation room within the Radiology Department.  A technologist or Nurse will obtain your medical history, verify that you are correctly prepped for the exam, and explain the procedure.  Afterwards,  an IV will be started in your arm and electrodes will be placed on your skin for EKG monitoring during the stress portion of the exam. Then you will be escorted to the PET/CT scanner.  There, staff will get you positioned on the scanner and obtain a blood pressure and EKG.  During the exam, you will continue to be connected to the EKG and blood pressure machines.  A small, safe amount of a radioactive tracer will be injected in your IV to obtain a series of pictures of your heart along with an injection of a stress agent.    After your Exam:  It is recommended that you eat a meal and drink a caffeinated beverage to counter act any effects of the stress agent.  Drink plenty of fluids for the remainder of the day and urinate frequently for the first couple of hours after the exam.  Your doctor will inform you of your test results within 7-10 business days.  For questions about your test or how to prepare for your test, please call: Rockwell Alexandria, Cardiac Imaging Nurse Navigator  Larey Brick, Cardiac  Imaging Nurse Navigator Office: [phone]    Follow-Up: At [facility], you and your health needs are our priority.  As part of our continuing mission to provide you with exceptional heart care, we have created designated Provider Care Teams.  These Care Teams include your primary Cardiologist (physician) and Advanced Practice Providers (APPs -  Physician Assistants and Nurse Practitioners) who all work together to provide you with the care you need, when you need it.  We recommend signing up for the patient  portal called "MyChart".  Sign up information is provided on this After Visit Summary.  MyChart is used to connect with patients for Virtual Visits (Telemedicine).  Patients are able to view lab/test results, encounter notes, upcoming appointments, etc.  Non-urgent messages can be sent to your provider as well.   To learn more about what you can do with MyChart, go to [email or web address].    Your next appointment:   2 month(s)  Provider:   Eligha Bridegroom, NP         Other Instructions  Mediterranean Diet A Mediterranean diet refers to food and lifestyle choices that are based on the traditions of countries located on the [place] Sea. It focuses on eating more fruits, vegetables, whole grains, beans, nuts, seeds, and heart-healthy fats, and eating less dairy, meat, eggs, and processed foods with added sugar, salt, and fat. This way of eating has been shown to help prevent certain conditions and improve outcomes for people who have chronic diseases, like kidney disease and heart disease. What are tips for following this plan? Reading food labels Check the serving size of packaged foods. For foods such as rice and pasta, the serving size refers to the amount of cooked product, not dry. Check the total fat in packaged foods. Avoid foods that have saturated fat or trans fats. Check the ingredient list for added sugars, such as corn syrup. Shopping  Buy a variety of foods that offer a balanced diet, including: Fresh fruits and vegetables (produce). Grains, beans, nuts, and seeds. Some of these may be available in unpackaged forms or large amounts (in bulk). Fresh seafood. Poultry and eggs. Low-fat dairy products. Buy whole ingredients instead of prepackaged foods. Buy fresh fruits and vegetables in-season from local farmers markets. Buy plain frozen fruits and vegetables. If you do not have access to quality fresh seafood, buy precooked frozen shrimp or canned fish, such as tuna,  salmon, or sardines. Stock your pantry so you always have certain foods on hand, such as olive oil, canned tuna, canned tomatoes, rice, pasta, and beans. Cooking Cook foods with extra-virgin olive oil instead of using butter or other vegetable oils. Have meat as a side dish, and have vegetables or grains as your main dish. This means having meat in small portions or adding small amounts of meat to foods like pasta or stew. Use beans or vegetables instead of meat in common dishes like chili or lasagna. Experiment with different cooking methods. Try roasting, broiling, steaming, and sauting vegetables. Add frozen vegetables to soups, stews, pasta, or rice. Add nuts or seeds for added healthy fats and plant protein at each meal. You can add these to yogurt, salads, or vegetable dishes. Marinate fish or vegetables using olive oil, lemon juice, garlic, and fresh herbs. Meal planning Plan to eat one vegetarian meal one day each week. Try to work up to two vegetarian meals, if possible. Eat seafood two or more times a week. Have healthy snacks readily available, such as: Vegetable sticks  with hummus. Greek yogurt. Fruit and nut trail mix. Eat balanced meals throughout the week. This includes: Fruit: 2-3 servings a day. Vegetables: 4-5 servings a day. Low-fat dairy: 2 servings a day. Fish, poultry, or lean meat: 1 serving a day. Beans and legumes: 2 or more servings a week. Nuts and seeds: 1-2 servings a day. Whole grains: 6-8 servings a day. Extra-virgin olive oil: 3-4 servings a day. Limit red meat and sweets to only a few servings a month. Lifestyle  Cook and eat meals together with your family, when possible. Drink enough fluid to keep your urine pale yellow. Be physically active every day. This includes: Aerobic exercise like running or swimming. Leisure activities like gardening, walking, or housework. Get 7-8 hours of sleep each night. If recommended by your health care provider,  drink red wine in moderation. This means 1 glass a day for nonpregnant women and 2 glasses a day for men. A glass of wine equals 5 oz (150 mL). What foods should I eat? Fruits Apples. Apricots. Avocado. Berries. Bananas. Cherries. Dates. Figs. Grapes. Lemons. Melon. Oranges. Peaches. Plums. Pomegranate. Vegetables Artichokes. Beets. Broccoli. Cabbage. Carrots. Eggplant. Green beans. Chard. Kale. Spinach. Onions. Leeks. Peas. Squash. Tomatoes. Peppers. Radishes. Grains Whole-grain pasta. Brown rice. Bulgur wheat. Polenta. Couscous. Whole-wheat bread. Orpah Cobb. Meats and other proteins Beans. Almonds. Sunflower seeds. Pine nuts. Peanuts. Cod. Salmon. Scallops. Shrimp. Tuna. Tilapia. Clams. Oysters. Eggs. Poultry without skin. Dairy Low-fat milk. Cheese. Greek yogurt. Fats and oils Extra-virgin olive oil. Avocado oil. Grapeseed oil. Beverages Water. Red wine. Herbal tea. Sweets and desserts Greek yogurt with honey. Baked apples. Poached pears. Trail mix. Seasonings and condiments Basil. Cilantro. Coriander. Cumin. Mint. Parsley. Sage. Rosemary. Tarragon. Garlic. Oregano. Thyme. Pepper. Balsamic vinegar. Tahini. Hummus. Tomato sauce. Olives. Mushrooms. The items listed above may not be a complete list of foods and beverages you can eat. Contact a dietitian for more information. What foods should I limit? This is a list of foods that should be eaten rarely or only on special occasions. Fruits Fruit canned in syrup. Vegetables Deep-fried potatoes (french fries). Grains Prepackaged pasta or rice dishes. Prepackaged cereal with added sugar. Prepackaged snacks with added sugar. Meats and other proteins Beef. Pork. Lamb. Poultry with skin. Hot dogs. Tomasa Blase. Dairy Ice cream. Sour cream. Whole milk. Fats and oils Butter. Canola oil. Vegetable oil. Beef fat (tallow). Lard. Beverages Juice. Sugar-sweetened soft drinks. Beer. Liquor and spirits. Sweets and desserts Cookies. Cakes. Pies.  Candy. Seasonings and condiments Mayonnaise. Pre-made sauces and marinades. The items listed above may not be a complete list of foods and beverages you should limit. Contact a dietitian for more information. Summary The Mediterranean diet includes both food and lifestyle choices. Eat a variety of fresh fruits and vegetables, beans, nuts, seeds, and whole grains. Limit the amount of red meat and sweets that you eat. If recommended by your health care provider, drink red wine in moderation. This means 1 glass a day for nonpregnant women and 2 glasses a day for men. A glass of wine equals 5 oz (150 mL). This information is not intended to replace advice given to you by your health care provider. Make sure you discuss any questions you have with your health care provider. Document Revised: 12/27/2019 Document Reviewed: 10/24/2019 Elsevier Patient Education  2023 Elsevier Inc. Adopting a Healthy Lifestyle.   Weight: Know what a healthy weight is for you (roughly BMI <25) and aim to maintain this. You can calculate your body mass index on your  smart phone  Diet: Aim for 7+ servings of fruits and vegetables daily Limit animal fats in diet for cholesterol and heart health - choose grass fed whenever available Avoid highly processed foods (fast food burgers, tacos, fried chicken, pizza, hot dogs, french fries)  Saturated fat comes in the form of butter, lard, coconut oil, margarine, partially hydrogenated oils, and fat in meat. These increase your risk of cardiovascular disease.  Use healthy plant oils, such as olive, canola, soy, corn, sunflower and peanut.  Whole foods such as fruits, vegetables and whole grains have fiber  Men need > 38 grams of fiber per day Women need > 25 grams of fiber per day  Load up on vegetables and fruits - one-half of your plate: Aim for color and variety, and remember that potatoes dont count. Go for whole grains - one-quarter of your plate: Whole wheat, barley,  wheat berries, quinoa, oats, brown rice, and foods made with them. If you want pasta, go with whole wheat pasta. Protein power - one-quarter of your plate: Fish, chicken, beans, and nuts are all healthy, versatile protein sources. Limit red meat. You need carbohydrates for energy! The type of carbohydrate is more important than the amount. Choose carbohydrates such as vegetables, fruits, whole grains, beans, and nuts in the place of white rice, white pasta, potatoes (baked or fried), macaroni and cheese, cakes, cookies, and donuts.  If youre thirsty, drink water. Coffee and tea are good in moderation, but skip sugary drinks and limit milk and dairy products to one or two daily servings. Keep sugar intake at 6 teaspoons or 24 grams or LESS       Exercise: Aim for 150 min of moderate intensity exercise weekly for heart health, and weights twice weekly for bone health Stay active - any steps are better than no steps! Aim for 7-9 hours of sleep daily

## 2023-05-10 ENCOUNTER — Ambulatory Visit: Payer: Medicare Other | Attending: Nurse Practitioner

## 2023-05-10 DIAGNOSIS — I2510 Atherosclerotic heart disease of native coronary artery without angina pectoris: Secondary | ICD-10-CM | POA: Diagnosis not present

## 2023-05-10 DIAGNOSIS — E785 Hyperlipidemia, unspecified: Secondary | ICD-10-CM | POA: Diagnosis not present

## 2023-05-10 DIAGNOSIS — I10 Essential (primary) hypertension: Secondary | ICD-10-CM

## 2023-05-11 DIAGNOSIS — K080 Exfoliation of teeth due to systemic causes: Secondary | ICD-10-CM | POA: Diagnosis not present

## 2023-05-11 LAB — BASIC METABOLIC PANEL
BUN/Creatinine Ratio: 14 (ref 10–24)
BUN: 15 mg/dL (ref 8–27)
CO2: 21 mmol/L (ref 20–29)
Calcium: 9.2 mg/dL (ref 8.6–10.2)
Chloride: 108 mmol/L — ABNORMAL HIGH (ref 96–106)
Creatinine, Ser: 1.07 mg/dL (ref 0.76–1.27)
Glucose: 113 mg/dL — ABNORMAL HIGH (ref 70–99)
Potassium: 4.4 mmol/L (ref 3.5–5.2)
Sodium: 144 mmol/L (ref 134–144)
eGFR: 71 mL/min/{1.73_m2} (ref >59)

## 2023-06-02 ENCOUNTER — Ambulatory Visit: Payer: Medicare Other | Admitting: Physician Assistant

## 2023-06-22 ENCOUNTER — Other Ambulatory Visit: Payer: Self-pay

## 2023-06-22 MED ORDER — VALSARTAN 40 MG PO TABS: 40.0000 mg | ORAL_TABLET | Freq: Every day | ORAL | 3 refills | Status: DC

## 2023-06-29 ENCOUNTER — Other Ambulatory Visit: Payer: Self-pay | Admitting: Nurse Practitioner
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
## 2023-07-03 ENCOUNTER — Encounter (HOSPITAL_COMMUNITY): Payer: Self-pay

## 2023-07-05 ENCOUNTER — Encounter (HOSPITAL_COMMUNITY)
Admission: RE | Admit: 2023-07-05 | Discharge: 2023-07-05 | Disposition: A | Discharge status: Home / Self Care | Payer: Medicare Other | Source: Ambulatory Visit | Attending: Nurse Practitioner | Admitting: Nurse Practitioner

## 2023-07-05 ENCOUNTER — Ambulatory Visit: Payer: Medicare Other | Admitting: Nurse Practitioner

## 2023-07-05 ENCOUNTER — Other Ambulatory Visit: Payer: Self-pay | Admitting: Nurse Practitioner

## 2023-07-05 DIAGNOSIS — E785 Hyperlipidemia, unspecified: Secondary | ICD-10-CM | POA: Diagnosis not present

## 2023-07-05 DIAGNOSIS — I10 Essential (primary) hypertension: Secondary | ICD-10-CM | POA: Diagnosis not present

## 2023-07-05 DIAGNOSIS — I2510 Atherosclerotic heart disease of native coronary artery without angina pectoris: Secondary | ICD-10-CM | POA: Diagnosis not present

## 2023-07-05 LAB — NM PET CT CARDIAC PERFUSION MULTI W/ABSOLUTE BLOODFLOW
LV dias vol: 66 mL (ref 62–150)
LV sys vol: 30 mL
MBFR: 3.15
Nuc Rest EF: 55 %
Nuc Stress EF: 66 %
Rest MBF: 0.67 ml/g/min
Rest Nuclear Isotope Dose: 19.1 mCi
SDS: 14
SRS: 0
SSS: 14
ST Depression (mm): 0 mm
Stress MBF: 2.11 ml/g/min
Stress Nuclear Isotope Dose: 19 mCi

## 2023-07-05 MED ORDER — NITROGLYCERIN 0.4 MG SL SUBL: 0.4000 mg | SUBLINGUAL_TABLET | SUBLINGUAL | 3 refills | Status: DC | PRN

## 2023-07-05 MED ORDER — RUBIDIUM RB82 GENERATOR (RUBYFILL)
19.1400 | PACK | Freq: Once | INTRAVENOUS | Status: AC
  Administered 2023-07-05: 19.14 via INTRAVENOUS

## 2023-07-05 MED ORDER — REGADENOSON 0.4 MG/5ML IV SOLN
INTRAVENOUS | Status: AC
  Filled 2023-07-05: qty 5

## 2023-07-05 MED ORDER — REGADENOSON 0.4 MG/5ML IV SOLN
0.4000 mg | Freq: Once | INTRAVENOUS | Status: AC
  Administered 2023-07-05: 0.4 mg via INTRAVENOUS

## 2023-07-05 MED ORDER — RUBIDIUM RB82 GENERATOR (RUBYFILL)
19.0100 | PACK | Freq: Once | INTRAVENOUS | Status: AC
  Administered 2023-07-05: 19.01 via INTRAVENOUS

## 2023-07-06 ENCOUNTER — Other Ambulatory Visit: Payer: Self-pay | Admitting: Nurse Practitioner

## 2023-07-06 MED ORDER — NITROGLYCERIN 0.4 MG SL SUBL: 0.4000 mg | SUBLINGUAL_TABLET | SUBLINGUAL | 3 refills | Status: AC | PRN

## 2023-07-07 ENCOUNTER — Ambulatory Visit: Payer: Medicare Other | Admitting: Nurse Practitioner

## 2023-07-26 ENCOUNTER — Other Ambulatory Visit: Payer: Self-pay

## 2023-07-26 MED ORDER — EZETIMIBE 10 MG PO TABS: 10.0000 mg | ORAL_TABLET | Freq: Every day | ORAL | 2 refills | Status: DC

## 2023-08-15 ENCOUNTER — Ambulatory Visit: Payer: Medicare Other | Admitting: Nurse Practitioner

## 2023-08-17 ENCOUNTER — Other Ambulatory Visit: Payer: Self-pay | Admitting: *Deleted

## 2023-08-17 MED ORDER — ROSUVASTATIN CALCIUM 20 MG PO TABS: 20.0000 mg | ORAL_TABLET | Freq: Every day | ORAL | 3 refills | Status: DC

## 2023-09-16 DIAGNOSIS — Z23 Encounter for immunization: Secondary | ICD-10-CM | POA: Diagnosis not present

## 2023-10-08 NOTE — Progress Notes (Unsigned)
Cardiology Office Note    Patient Name: Jason Bell Date of Encounter: 10/08/2023  Primary Care Provider:  Renford Dills, MD Primary Cardiologist:  Tonny Bollman, MD Primary Electrophysiologist: None   Past Medical History    Past Medical History:  Diagnosis Date   Congenital nystagmus    Coronary artery disease    s/p multivessel PCI 2008 2 stents, Dr. Tonny Bollman   Hyperlipidemia    Hypertension     History of Present Illness  Jason Bell is a 77 y.o. male with a PMH of CAD s/p DES to LAD and left circumflex 2008 chronically occluded RCA, HTN, HLD who presents today for follow-up.  Jason Bell has been followed by Dr. Excell Seltzer since 2008.  He has a PMH of CAD and underwent LHC in 2008 after complaints of class III angina DES placed to LAD and mid circumflex.  He was noted to have CTO of RCA with left-to-right collaterals.  He was last seen by Jason Bridegroom, NP on 05/02/2023 and had complained of fatigue with activity.  He underwent a cardiac PET to evaluate for worsening ischemia that revealed a large area of moderate ischemia in the inferior septal wall with normal EF consistent with known RCA occlusion.  He reported no complaints of chest pain and decision was made to continue medical therapy with plan to pursue left heart cath in the future if symptoms persist.  The patient, with a history of hypertension and a stent placement in 2008, presents for a follow-up visit after a PET stress test in May. The patient initially sought medical attention due to a rise in blood pressure, which was managed with the addition of valsartan to his regimen. He denies any adverse effects from the new medication, even as his blood pressure decreased to the current level.  The patient denies any recurrence of the fatigue that prompted the stress test, attributing any tiredness to age and a lack of physical activity. He reports being able to use a treadmill without difficulty and engage in  light physical activities such as working on a car and riding a motorcycle. He also performs household chores and runs errands due to his spouse's increasing incapacitation. The patient expresses a preference for conservative treatment and is reluctant to undergo another surgery unless he experiences symptoms.  Review of Systems  Please see the history of present illness.    All other systems reviewed and are otherwise negative except as noted above.  Physical Exam    Wt Readings from Last 3 Encounters:  05/02/23 159 lb 9.6 oz (72.4 kg)  04/22/22 155 lb 6.4 oz (70.5 kg)  02/17/21 169 lb 9.6 oz (76.9 kg)   SA:YTKZS were no vitals filed for this visit.,There is no height or weight on file to calculate BMI. GEN: Well nourished, well developed in no acute distress Neck: No JVD; No carotid bruits Pulmonary: Clear to auscultation without rales, wheezing or rhonchi  Cardiovascular: Normal rate. Regular rhythm. Normal S1. Normal S2.   Murmurs: There is no murmur.  ABDOMEN: Soft, non-tender, non-distended EXTREMITIES:  No edema; No deformity   EKG/LABS/ Recent Cardiac Studies   ECG personally reviewed by me today -none completed today  Risk Assessment/Calculations:          Lab Results  Component Value Date   WBC 10.1 06/06/2020   HGB 16.4 06/06/2020   HCT 45.9 06/06/2020   MCV 89.3 06/06/2020   PLT 203 06/06/2020   Lab Results  Component Value Date  CREATININE 1.07 05/10/2023   BUN 15 05/10/2023   NA 144 05/10/2023   K 4.4 05/10/2023   CL 108 (H) 05/10/2023   CO2 21 05/10/2023   Lab Results  Component Value Date   CHOL 159 06/13/2019   HDL 46 06/13/2019   LDLCALC 89 06/13/2019   TRIG 119 06/13/2019   CHOLHDL 3.5 06/13/2019    No results found for: "HGBA1C" Assessment & Plan    1.  Coronary artery disease: -s/p LHC in 2008 with DES to LAD and left circumflex with CTO of RCA with collaterals.   -Previous PET stress test showed worsening ischemia in a large area with  a moderate defect. No new symptoms of chest pain or shortness of breath. Patient is able to exercise on a treadmill without issue. Patient prefers conservative management. -Continue current medical therapy. -Monitor for any new symptoms and report immediately.  2.  Essential hypertension: -Patient's blood pressure today was controlled at 114/72 with no adverse effects noted of valsartan -Continue Valsartan 40 mg daily, Toprol-XL 50 mg -Monitor blood pressure regularly.  3.  DM type II: -Patient/hemoglobin A1c was 6.2 -Continue current treatment plan per PCP with Jardiance 10 mg daily  4.  Mixed hyperlipidemia: -Patient's last LDL cholesterol was 64 -Cholesterol to be checked by PCP in December  Disposition: Follow-up with Tonny Bollman, MD or APP in 12 months    Signed, Napoleon Form, Leodis Rains, NP 10/08/2023, 4:37 PM [facility]

## 2023-10-09 ENCOUNTER — Ambulatory Visit: Payer: Medicare Other | Attending: Nurse Practitioner | Admitting: Nurse Practitioner

## 2023-10-09 ENCOUNTER — Encounter: Payer: Self-pay | Admitting: Nurse Practitioner

## 2023-10-09 VITALS — BP 114/72 | HR 66 | Ht 63.0 in | Wt 166.8 lb

## 2023-10-09 DIAGNOSIS — E118 Type 2 diabetes mellitus with unspecified complications: Secondary | ICD-10-CM

## 2023-10-09 DIAGNOSIS — E785 Hyperlipidemia, unspecified: Secondary | ICD-10-CM | POA: Diagnosis not present

## 2023-10-09 DIAGNOSIS — I10 Essential (primary) hypertension: Secondary | ICD-10-CM | POA: Diagnosis not present

## 2023-10-09 DIAGNOSIS — I2510 Atherosclerotic heart disease of native coronary artery without angina pectoris: Secondary | ICD-10-CM

## 2023-10-09 DIAGNOSIS — R5383 Other fatigue: Secondary | ICD-10-CM

## 2023-10-09 NOTE — Patient Instructions (Signed)
Medication Instructions:  Your physician recommends that you continue on your current medications as directed. Please refer to the Current Medication list given to you today. *If you need a refill on your cardiac medications before your next appointment, please call your pharmacy*   Lab Work: None ordered   Testing/Procedures: None ordered   Follow-Up: At [facility], you and your health needs are our priority.  As part of our continuing mission to provide you with exceptional heart care, we have created designated Provider Care Teams.  These Care Teams include your primary Cardiologist (physician) and Advanced Practice Providers (APPs -  Physician Assistants and Nurse Practitioners) who all work together to provide you with the care you need, when you need it.  We recommend signing up for the patient portal called "MyChart".  Sign up information is provided on this After Visit Summary.  MyChart is used to connect with patients for Virtual Visits (Telemedicine).  Patients are able to view lab/test results, encounter notes, upcoming appointments, etc.  Non-urgent messages can be sent to your provider as well.   To learn more about what you can do with MyChart, go to [email or web address].    Your next appointment:   12 month(s)  Provider:   Tonny Bollman, MD     Other Instructions

## 2023-10-10 DIAGNOSIS — H33303 Unspecified retinal break, bilateral: Secondary | ICD-10-CM | POA: Diagnosis not present

## 2023-11-07 DIAGNOSIS — Z23 Encounter for immunization: Secondary | ICD-10-CM | POA: Diagnosis not present

## 2023-11-07 DIAGNOSIS — I25119 Atherosclerotic heart disease of native coronary artery with unspecified angina pectoris: Secondary | ICD-10-CM | POA: Diagnosis not present

## 2023-11-07 DIAGNOSIS — E1169 Type 2 diabetes mellitus with other specified complication: Secondary | ICD-10-CM | POA: Diagnosis not present

## 2023-11-07 DIAGNOSIS — Z0000 Encounter for general adult medical examination without abnormal findings: Secondary | ICD-10-CM | POA: Diagnosis not present

## 2023-11-07 DIAGNOSIS — Z5181 Encounter for therapeutic drug level monitoring: Secondary | ICD-10-CM | POA: Diagnosis not present

## 2023-11-07 DIAGNOSIS — E119 Type 2 diabetes mellitus without complications: Secondary | ICD-10-CM | POA: Diagnosis not present

## 2023-11-07 DIAGNOSIS — E7800 Pure hypercholesterolemia, unspecified: Secondary | ICD-10-CM | POA: Diagnosis not present

## 2023-11-23 ENCOUNTER — Other Ambulatory Visit: Payer: Self-pay

## 2023-11-23 MED ORDER — ROSUVASTATIN CALCIUM 20 MG PO TABS: 20.0000 mg | ORAL_TABLET | Freq: Every day | ORAL | 3 refills | Status: DC

## 2023-11-23 MED ORDER — VALSARTAN 40 MG PO TABS: 40.0000 mg | ORAL_TABLET | Freq: Every day | ORAL | 3 refills | Status: DC

## 2023-12-11 DIAGNOSIS — K080 Exfoliation of teeth due to systemic causes: Secondary | ICD-10-CM | POA: Diagnosis not present

## 2023-12-18 DIAGNOSIS — K080 Exfoliation of teeth due to systemic causes: Secondary | ICD-10-CM | POA: Diagnosis not present

## 2024-03-29 ENCOUNTER — Other Ambulatory Visit: Payer: Self-pay | Admitting: Nurse Practitioner

## 2024-06-24 DIAGNOSIS — K080 Exfoliation of teeth due to systemic causes: Secondary | ICD-10-CM | POA: Diagnosis not present

## 2024-06-30 ENCOUNTER — Other Ambulatory Visit: Payer: Self-pay | Admitting: Nurse Practitioner

## 2024-09-26 DIAGNOSIS — K080 Exfoliation of teeth due to systemic causes: Secondary | ICD-10-CM | POA: Diagnosis not present

## 2024-11-29 ENCOUNTER — Other Ambulatory Visit: Payer: Self-pay | Admitting: Nurse Practitioner

## 2024-12-31 ENCOUNTER — Other Ambulatory Visit: Payer: Self-pay | Admitting: Cardiovascular Disease

## 2024-12-31 ENCOUNTER — Other Ambulatory Visit: Payer: Self-pay | Admitting: Nurse Practitioner

## 2025-01-06 NOTE — Telephone Encounter (Signed)
 In accordance with refill protocols, please review and address the following requirements before this medication refill can be authorized:  Labs

## 2025-01-10 NOTE — Progress Notes (Signed)
 Jason Bell                                          MRN: [id number]   01/10/2025   The [facility] Quality Team Specialist reviewed this patient medical record for the purposes of chart review for care gap closure. The following were reviewed: chart review for care gap closure-kidney health evaluation for diabetes:eGFR  and uACR.    [facility] Quality Team

## 2025-03-19 ENCOUNTER — Ambulatory Visit: Admitting: Physician Assistant
